# Patient Record
Sex: Female | Born: 1990 | Race: White | Hispanic: No | Marital: Single | Smoking: Never smoker
Health system: Southern US, Community
[De-identification: ages and names within clinical notes are randomized; demographics above are authoritative.]

## PROBLEM LIST (undated history)

## (undated) DIAGNOSIS — F329 Major depressive disorder, single episode, unspecified: Secondary | ICD-10-CM

## (undated) DIAGNOSIS — M2550 Pain in unspecified joint: Secondary | ICD-10-CM

## (undated) DIAGNOSIS — F4310 Post-traumatic stress disorder, unspecified: Secondary | ICD-10-CM

## (undated) DIAGNOSIS — Q774 Achondroplasia: Secondary | ICD-10-CM

## (undated) DIAGNOSIS — F419 Anxiety disorder, unspecified: Secondary | ICD-10-CM

## (undated) DIAGNOSIS — Z9889 Other specified postprocedural states: Secondary | ICD-10-CM

## (undated) DIAGNOSIS — F32A Depression, unspecified: Secondary | ICD-10-CM

## (undated) DIAGNOSIS — M1990 Unspecified osteoarthritis, unspecified site: Secondary | ICD-10-CM

## (undated) DIAGNOSIS — M329 Systemic lupus erythematosus, unspecified: Secondary | ICD-10-CM

## (undated) HISTORY — DX: Unspecified osteoarthritis, unspecified site: M19.90

## (undated) HISTORY — PX: OTHER SURGICAL HISTORY: SHX169

## (undated) HISTORY — DX: Systemic lupus erythematosus, unspecified: M32.9

## (undated) HISTORY — DX: Depression, unspecified: F32.A

## (undated) HISTORY — DX: Major depressive disorder, single episode, unspecified: F32.9

---
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
## 2012-01-17 DIAGNOSIS — M25552 Pain in left hip: Secondary | ICD-10-CM | POA: Insufficient documentation

## 2012-08-15 IMAGING — CR DG CHEST 2V
1 series · 2 of 2 positions shown · non-contrast
Comparison: none

REASON FOR EXAM: rt side rib cage pain
COMMENTS:

[Series 1: view not recorded · 0.17mm/px · 2 of 2 slices shown]
[im 1/2]
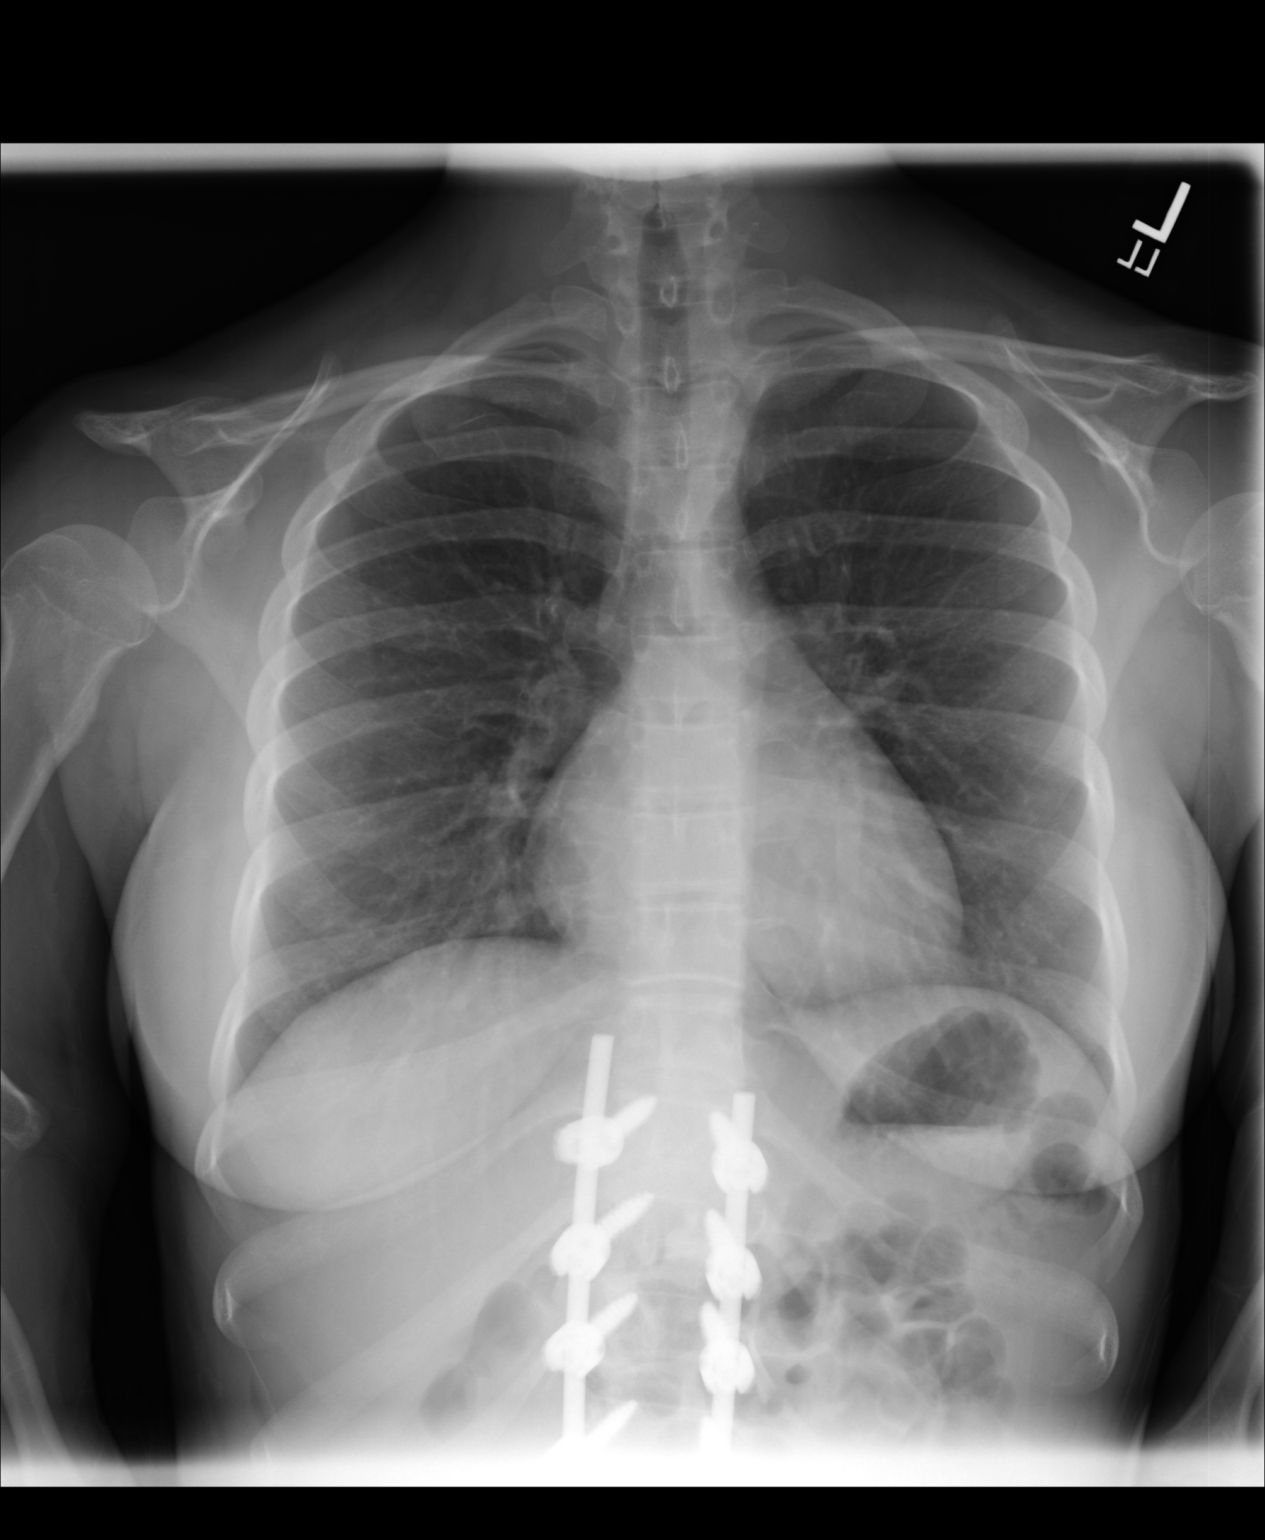
[im 2/2]
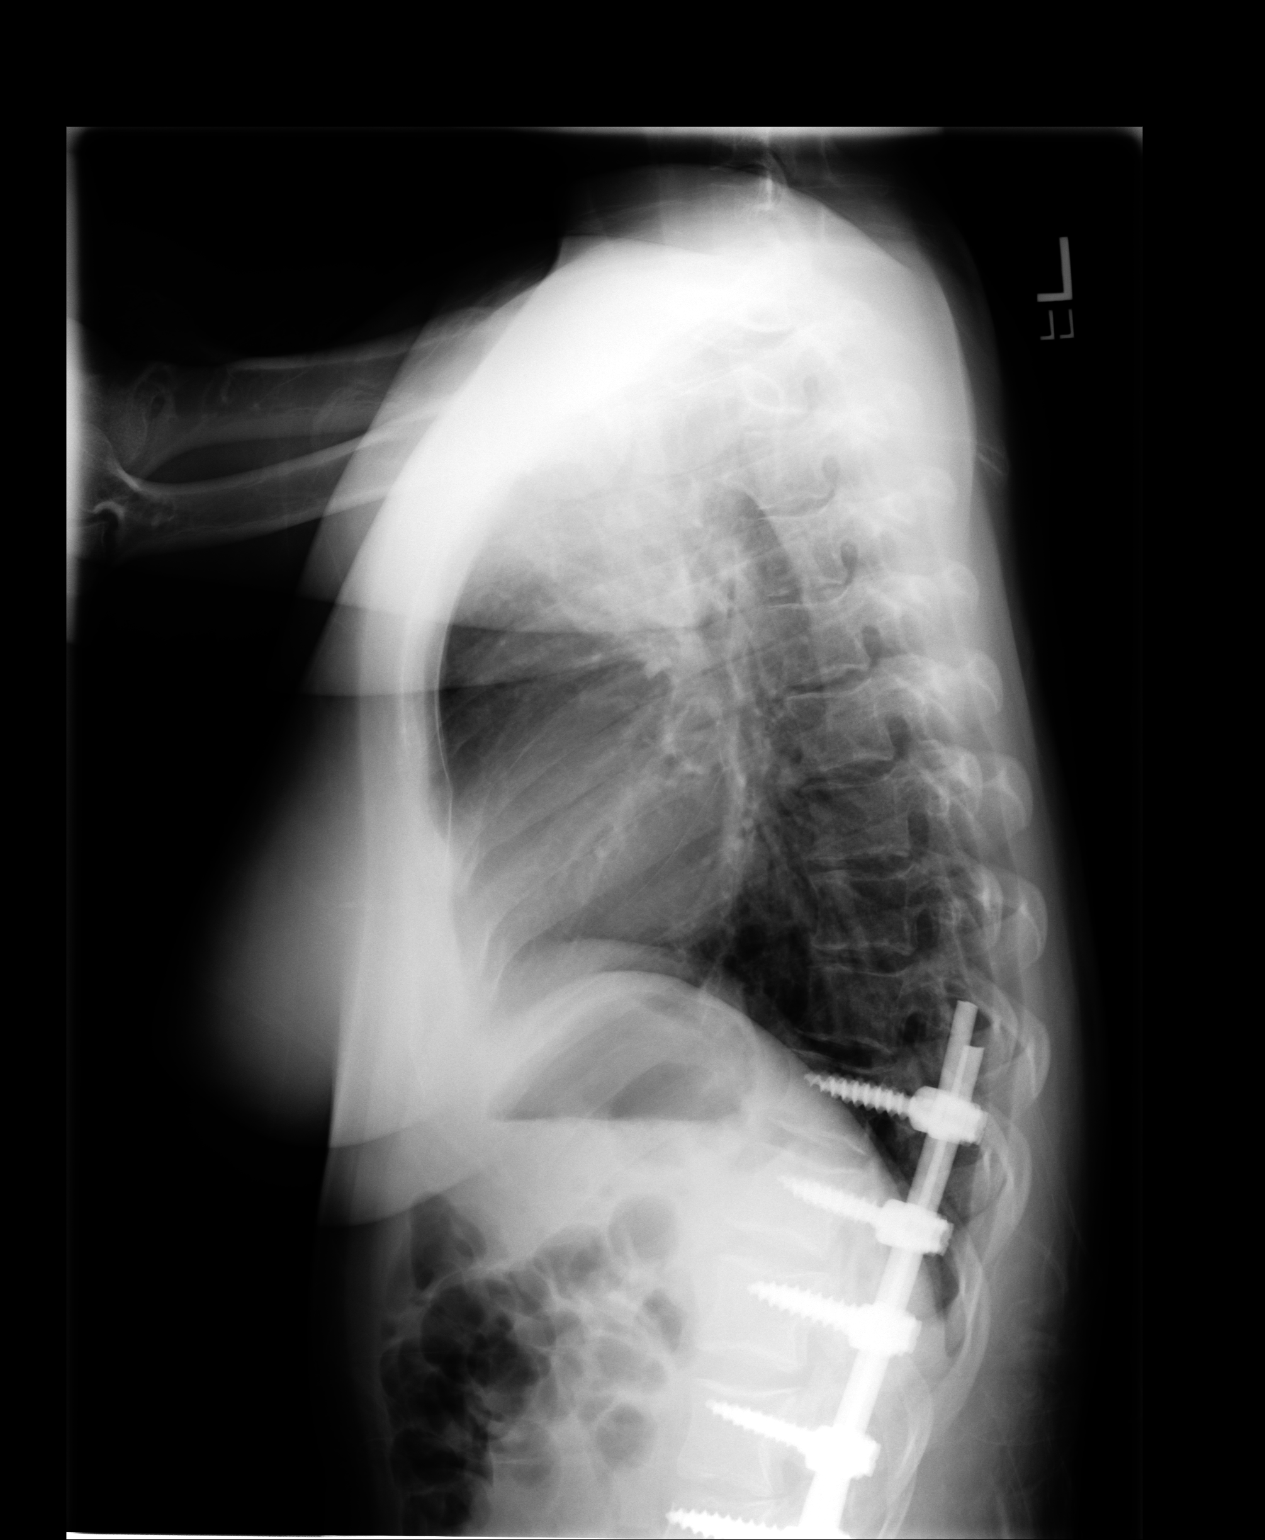

[2 of 2 positions shown; findings below may reference images not displayed]

PROCEDURE:     DXR - DXR CHEST PA (OR AP) AND LATERAL  - December 31, 2010  [DATE]

RESULT:     The lungs are well-expanded. There is no focal infiltrate. There
is no evidence of a pleural effusion. The cardiac silhouette is normal in
size. The pulmonary vascularity is not engorged. There is no pleural
effusion.
IMPRESSION: There is hyperinflation which likely reflects reactive
airway disease. I see no evidence of pneumonia nor pneumothorax or pleural
effusion. No rib abnormality is demonstrated.

## 2013-03-22 DIAGNOSIS — M542 Cervicalgia: Secondary | ICD-10-CM | POA: Insufficient documentation

## 2013-10-06 ENCOUNTER — Other Ambulatory Visit: Payer: Self-pay | Admitting: Student

## 2013-10-06 LAB — CBC WITH DIFFERENTIAL/PLATELET
BASOS ABS: 0 10*3/uL (ref 0.0–0.1)
BASOS PCT: 0.7 %
Eosinophil #: 0.1 10*3/uL (ref 0.0–0.7)
Eosinophil %: 1.6 %
HCT: 40.3 % (ref 35.0–47.0)
HGB: 13.6 g/dL (ref 12.0–16.0)
LYMPHS ABS: 1.5 10*3/uL (ref 1.0–3.6)
Lymphocyte %: 32 %
MCH: 32.4 pg (ref 26.0–34.0)
MCHC: 33.8 g/dL (ref 32.0–36.0)
MCV: 96 fL (ref 80–100)
MONO ABS: 0.5 x10 3/mm (ref 0.2–0.9)
Monocyte %: 10.2 %
NEUTROS PCT: 55.5 %
Neutrophil #: 2.5 10*3/uL (ref 1.4–6.5)
PLATELETS: 295 10*3/uL (ref 150–440)
RBC: 4.2 10*6/uL (ref 3.80–5.20)
RDW: 12.4 % (ref 11.5–14.5)
WBC: 4.6 10*3/uL (ref 3.6–11.0)

## 2013-10-06 LAB — T4, FREE: Free Thyroxine: 1.15 ng/dL (ref 0.76–1.46)

## 2013-10-06 LAB — TSH: THYROID STIMULATING HORM: 0.987 u[IU]/mL

## 2014-06-13 DIAGNOSIS — M25311 Other instability, right shoulder: Secondary | ICD-10-CM | POA: Insufficient documentation

## 2014-06-13 DIAGNOSIS — R059 Cough, unspecified: Secondary | ICD-10-CM | POA: Insufficient documentation

## 2014-06-13 DIAGNOSIS — R200 Anesthesia of skin: Secondary | ICD-10-CM | POA: Insufficient documentation

## 2014-06-13 DIAGNOSIS — R05 Cough: Secondary | ICD-10-CM | POA: Insufficient documentation

## 2018-03-13 DIAGNOSIS — M329 Systemic lupus erythematosus, unspecified: Secondary | ICD-10-CM | POA: Diagnosis not present

## 2018-03-13 DIAGNOSIS — E063 Autoimmune thyroiditis: Secondary | ICD-10-CM | POA: Diagnosis not present

## 2018-05-14 DIAGNOSIS — E063 Autoimmune thyroiditis: Secondary | ICD-10-CM | POA: Diagnosis not present

## 2018-05-14 DIAGNOSIS — M329 Systemic lupus erythematosus, unspecified: Secondary | ICD-10-CM | POA: Diagnosis not present

## 2018-06-30 ENCOUNTER — Ambulatory Visit: Payer: Self-pay | Admitting: Internal Medicine

## 2018-07-14 ENCOUNTER — Ambulatory Visit: Payer: 59 | Admitting: Internal Medicine

## 2018-07-14 ENCOUNTER — Encounter: Payer: Self-pay | Admitting: Internal Medicine

## 2018-07-14 DIAGNOSIS — M2550 Pain in unspecified joint: Secondary | ICD-10-CM | POA: Diagnosis not present

## 2018-07-14 DIAGNOSIS — E049 Nontoxic goiter, unspecified: Secondary | ICD-10-CM

## 2018-07-14 DIAGNOSIS — Q774 Achondroplasia: Secondary | ICD-10-CM

## 2018-07-14 DIAGNOSIS — Z0000 Encounter for general adult medical examination without abnormal findings: Secondary | ICD-10-CM

## 2018-07-14 DIAGNOSIS — F324 Major depressive disorder, single episode, in partial remission: Secondary | ICD-10-CM

## 2018-07-14 DIAGNOSIS — R200 Anesthesia of skin: Secondary | ICD-10-CM

## 2018-07-14 DIAGNOSIS — R202 Paresthesia of skin: Secondary | ICD-10-CM

## 2018-07-14 MED ORDER — VENLAFAXINE HCL ER 37.5 MG PO CP24: 37.5000 mg | ORAL_CAPSULE | Freq: Every day | ORAL | 3 refills | Status: DC

## 2018-07-14 MED ORDER — MELOXICAM 15 MG PO TABS: 15.0000 mg | ORAL_TABLET | Freq: Every day | ORAL | 3 refills | Status: DC

## 2018-07-14 NOTE — Progress Notes (Signed)
New Patient Office Visit  Subjective:  Patient ID: Alexis Williams, female    DOB: 08-30-1990  Age: 28 y.o. MRN: [id number]  CC:  Chief Complaint  Patient presents with  . New Patient (Initial Visit)    pt is concerned about her heart  . Lupus  . Arthritis  . Alopecia   HPI Alexis Williams is 28 year old Caucasian female presents for establishment of PCP Pt has multiple complaints with some new diagnoses of  Lupus and Hashimotos 2019, ulcers, fatigue and rash, alopecia.  Lately pt is c/o severe stiffness in her joints, she is not sure of RA. She feels like she is unable to carry out all her daily activities any more, every day is becoming a struggle, she is unable to do things that she had enjoyed in the past. This is contributing to depression. She is also losing her hair, no treatment or follow up has been made for further test for Hashimotos  Her past medical history consists of  achondroplasia. She underwent posterior spinal fusion due to spinal stenosis and kyphotic deformity in 2008. She had developed progressive back pain over the spring of 2012 with reports of numbness in her lower extremity. The MRI scan done at that time showed stenosis above and below her previous surgical fusion. Therefore, she underwent removal of the spinal instrumentation and application of bone graft by Dr. Guss Bunde and Dr. Fatima Sanger in July of 2012. Marland Kitchen Past Medical History:  Diagnosis Date  . Arthritis   . Depression   . Lupus ([place])      Family History  Problem Relation Age of Onset  . Lung cancer Maternal Grandmother   . COPD Maternal Grandmother     Social History   Socioeconomic History  . Marital status: Single    Spouse name: Not on file  . Number of children: Not on file  . Years of education: Not on file  . Highest education level: Not on file  Occupational History  . Not on file  Social Needs  . Financial resource strain: Not on file  . Food insecurity:    Worry: Not on file     Inability: Not on file  . Transportation needs:    Medical: Not on file    Non-medical: Not on file  Tobacco Use  . Smoking status: Never Smoker  . Smokeless tobacco: Never Used  Substance and Sexual Activity  . Alcohol use: Never    Frequency: Never  . Drug use: Never  . Sexual activity: Not on file  Lifestyle  . Physical activity:    Days per week: Not on file    Minutes per session: Not on file  . Stress: Not on file  Relationships  . Social connections:    Talks on phone: Not on file    Gets together: Not on file    Attends religious service: Not on file    Active member of club or organization: Not on file    Attends meetings of clubs or organizations: Not on file    Relationship status: Not on file  . Intimate partner violence:    Fear of current or ex partner: Not on file    Emotionally abused: Not on file    Physically abused: Not on file    Forced sexual activity: Not on file  Other Topics Concern  . Not on file  Social History Narrative  . Not on file    ROS Review of Systems  Constitutional:  Positive for fatigue. Negative for chills and diaphoresis.       Hair loss  HENT: Negative for ear pain, postnasal drip and sinus pressure.   Eyes: Negative for photophobia, discharge, redness, itching and visual disturbance.  Respiratory: Negative for cough, shortness of breath and wheezing.   Cardiovascular: Negative for chest pain, palpitations and leg swelling.  Gastrointestinal: Negative for abdominal pain, constipation, diarrhea, nausea and vomiting.  Genitourinary: Negative for dysuria and flank pain.  Musculoskeletal: Positive for arthralgias and gait problem. Negative for back pain and neck pain.       Joint pain and selling   Skin: Negative for color change.  Allergic/Immunologic: Negative for environmental allergies and food allergies.  Neurological: Positive for numbness. Negative for dizziness and headaches.  Hematological: Does not bruise/bleed easily.   Psychiatric/Behavioral: Positive for agitation. Negative for behavioral problems (depression), hallucinations and suicidal ideas.       Depression     Objective:   Today's Vitals: BP 134/80 (BP Location: Right Arm, Patient Position: Sitting, Cuff Size: Normal)   Pulse 80   Ht 4' 1.75" (1.264 m)   Wt 92 lb (41.7 kg)   SpO2 97%   BMI 26.13 kg/m   Physical Exam Constitutional:      General: She is not in acute distress.    Appearance: She is well-developed. She is not diaphoretic.  HENT:     Head: Normocephalic and atraumatic.     Nose: Nose normal.     Mouth/Throat:     Pharynx: No oropharyngeal exudate.  Eyes:     Pupils: Pupils are equal, round, and reactive to light.  Neck:     Musculoskeletal: Normal range of motion and neck supple.     Thyroid: Thyromegaly present.     Vascular: No JVD.     Trachea: No tracheal deviation.  Cardiovascular:     Rate and Rhythm: Normal rate and regular rhythm.     Heart sounds: Normal heart sounds. No murmur. No friction rub. No gallop.   Pulmonary:     Effort: Pulmonary effort is normal. No respiratory distress.     Breath sounds: No wheezing or rales.  Chest:     Chest wall: No tenderness.  Abdominal:     General: Bowel sounds are normal.     Palpations: Abdomen is soft.  Musculoskeletal:        General: Swelling, tenderness and deformity present.  Lymphadenopathy:     Cervical: No cervical adenopathy.  Skin:    General: Skin is warm and dry.     Findings: Erythema present.  Neurological:     Mental Status: She is alert and oriented to person, place, and time.     Cranial Nerves: No cranial nerve deficit.  Psychiatric:        Behavior: Behavior normal.        Thought Content: Thought content normal.        Judgment: Judgment normal.    Assessment & Plan:  1. Routine general medical examination at a health care facility - Labs ordered   2. Goiter - U/S of thyroid is ordered,  Labs for Hashimoto is ordered    3.  Achondroplasia - Per ortho   4. Arthralgia, unspecified joint - Continue mobic as before . ANA, sed rate RA factor ordered   5. Depression, major, single episode, in partial remission (HCC) - Refilled Effexor  6. Numbness and tingling - Check M38 and folic acid   Problem List Items Addressed This Visit  Endocrine   Goiter   Relevant Orders   CBC with Differential/Platelet   Lipid Panel With LDL/HDL Ratio   TSH   T4, free   Comprehensive metabolic panel   Sed Rate (ESR)   ANA w/Reflex if Positive   US THYROID   Thyroglobulin antibody   Thyroid peroxidase antibody   Vitamin D 1,25 dihydroxy   B12 and Folate Panel   Fe+TIBC+Fer     Musculoskeletal and Integument   Achondroplasia   Relevant Orders   CBC with Differential/Platelet   Lipid Panel With LDL/HDL Ratio   TSH   T4, free   Comprehensive metabolic panel   Sed Rate (ESR)   ANA w/Reflex if Positive   US THYROID   Thyroglobulin antibody   Thyroid peroxidase antibody   Vitamin D 1,25 dihydroxy   B12 and Folate Panel   Fe+TIBC+Fer     Other   Routine general medical examination at a health care facility   Relevant Orders   CBC with Differential/Platelet   Lipid Panel With LDL/HDL Ratio   TSH   T4, free   Comprehensive metabolic panel   Sed Rate (ESR)   ANA w/Reflex if Positive   US THYROID   Thyroglobulin antibody   Thyroid peroxidase antibody   Vitamin D 1,25 dihydroxy   B12 and Folate Panel   Fe+TIBC+Fer   Arthralgia   Depression, major, single episode, in partial remission (HCC)   Relevant Medications   venlafaxine XR (EFFEXOR XR) 37.5 MG 24 hr capsule    Other Visit Diagnoses    Numbness and tingling          Outpatient Encounter Medications as of 07/14/2018  Medication Sig  . hydroxychloroquine (PLAQUENIL) 200 MG tablet Take 200 mg by mouth daily. Take two tablets by mouth once daily for 30 days  . [DISCONTINUED] meloxicam (MOBIC) 15 MG tablet Take 15 mg by mouth daily. Take one  tablet by mouth daily for 30 days.  . [DISCONTINUED] venlafaxine (EFFEXOR) 37.5 MG tablet Take 37.5 mg by mouth daily. Take one capsule by mouth once daily with food  . meloxicam (MOBIC) 15 MG tablet Take 1 tablet (15 mg total) by mouth daily.  Marland Kitchen venlafaxine XR (EFFEXOR XR) 37.5 MG 24 hr capsule Take 1 capsule (37.5 mg total) by mouth daily with breakfast.   No facility-administered encounter medications on file as of 07/14/2018.     Follow-up: No follow-ups on file.   Clayborn Bigness, MD

## 2018-07-21 ENCOUNTER — Encounter: Payer: Self-pay | Admitting: Internal Medicine

## 2018-07-23 DIAGNOSIS — Z0000 Encounter for general adult medical examination without abnormal findings: Secondary | ICD-10-CM | POA: Insufficient documentation

## 2018-07-23 DIAGNOSIS — F324 Major depressive disorder, single episode, in partial remission: Secondary | ICD-10-CM | POA: Insufficient documentation

## 2018-07-23 DIAGNOSIS — E049 Nontoxic goiter, unspecified: Secondary | ICD-10-CM | POA: Insufficient documentation

## 2018-07-23 DIAGNOSIS — M2550 Pain in unspecified joint: Secondary | ICD-10-CM | POA: Insufficient documentation

## 2018-10-16 ENCOUNTER — Other Ambulatory Visit: Payer: Self-pay

## 2018-10-16 DIAGNOSIS — E049 Nontoxic goiter, unspecified: Secondary | ICD-10-CM

## 2018-10-16 DIAGNOSIS — Z0000 Encounter for general adult medical examination without abnormal findings: Secondary | ICD-10-CM

## 2018-10-16 DIAGNOSIS — Q774 Achondroplasia: Secondary | ICD-10-CM

## 2018-10-26 LAB — COMPREHENSIVE METABOLIC PANEL
ALT: 16 IU/L (ref 0–32)
AST: 17 IU/L (ref 0–40)
Albumin/Globulin Ratio: 2.2 (ref 1.2–2.2)
Albumin: 5 g/dL (ref 3.9–5.0)
Alkaline Phosphatase: 60 IU/L (ref 39–117)
BUN/Creatinine Ratio: 17 (ref 9–23)
BUN: 11 mg/dL (ref 6–20)
Bilirubin Total: 1.3 mg/dL — ABNORMAL HIGH (ref 0.0–1.2)
CO2: 22 mmol/L (ref 20–29)
Calcium: 10.1 mg/dL (ref 8.7–10.2)
Chloride: 104 mmol/L (ref 96–106)
Creatinine, Ser: 0.65 mg/dL (ref 0.57–1.00)
GFR calc non Af Amer: 124 mL/min/{1.73_m2} (ref >59)
GFR, EST AFRICAN AMERICAN: 143 mL/min/{1.73_m2} (ref >59)
GLOBULIN, TOTAL: 2.3 g/dL (ref 1.5–4.5)
Glucose: 86 mg/dL (ref 65–99)
Potassium: 4.1 mmol/L (ref 3.5–5.2)
Sodium: 140 mmol/L (ref 134–144)
Total Protein: 7.3 g/dL (ref 6.0–8.5)

## 2018-10-26 LAB — LIPID PANEL WITH LDL/HDL RATIO
Cholesterol, Total: 173 mg/dL (ref 100–199)
HDL: 70 mg/dL (ref >39)
LDL Calculated: 90 mg/dL (ref 0–99)
LDl/HDL Ratio: 1.3 ratio (ref 0.0–3.2)
Triglycerides: 67 mg/dL (ref 0–149)
VLDL Cholesterol Cal: 13 mg/dL (ref 5–40)

## 2018-10-26 LAB — IRON,TIBC AND FERRITIN PANEL
Ferritin: 51 ng/mL (ref 15–150)
IRON: 118 ug/dL (ref 27–159)
Iron Saturation: 30 % (ref 15–55)
Total Iron Binding Capacity: 387 ug/dL (ref 250–450)
UIBC: 269 ug/dL (ref 131–425)

## 2018-10-26 LAB — SEDIMENTATION RATE: Sed Rate: 2 mm/hr (ref 0–32)

## 2018-10-26 LAB — T4, FREE: Free T4: 1.56 ng/dL (ref 0.82–1.77)

## 2018-10-26 LAB — VITAMIN D 1,25 DIHYDROXY
Vitamin D 1, 25 (OH)2 Total: 49 pg/mL
Vitamin D2 1, 25 (OH)2: <10 pg/mL
Vitamin D3 1, 25 (OH)2: 49 pg/mL

## 2018-10-26 LAB — B12 AND FOLATE PANEL
Folate: >20 ng/mL (ref >3.0)
Vitamin B-12: 737 pg/mL (ref 232–1245)

## 2018-10-26 LAB — ANA W/REFLEX IF POSITIVE: Anti Nuclear Antibody (ANA): NEGATIVE

## 2018-11-10 ENCOUNTER — Other Ambulatory Visit: Payer: Self-pay | Admitting: Internal Medicine

## 2018-11-10 DIAGNOSIS — M2550 Pain in unspecified joint: Secondary | ICD-10-CM

## 2018-11-10 DIAGNOSIS — Q774 Achondroplasia: Secondary | ICD-10-CM

## 2018-11-10 NOTE — Progress Notes (Unsigned)
rheu

## 2022-06-17 ENCOUNTER — Other Ambulatory Visit
Admission: RE | Admit: 2022-06-17 | Discharge: 2022-06-17 | Disposition: A | Discharge status: Home / Self Care | Payer: Medicaid Other | Source: Ambulatory Visit | Attending: Nurse Practitioner | Admitting: Nurse Practitioner

## 2022-06-17 ENCOUNTER — Encounter: Payer: Self-pay | Admitting: Nurse Practitioner

## 2022-06-17 ENCOUNTER — Ambulatory Visit (INDEPENDENT_AMBULATORY_CARE_PROVIDER_SITE_OTHER): Payer: Medicaid Other | Admitting: Nurse Practitioner

## 2022-06-17 VITALS — BP 116/64 | HR 82 | Temp 97.0°F | Resp 16 | Ht <= 58 in | Wt 88.2 lb

## 2022-06-17 DIAGNOSIS — R5383 Other fatigue: Secondary | ICD-10-CM

## 2022-06-17 DIAGNOSIS — F32A Depression, unspecified: Secondary | ICD-10-CM

## 2022-06-17 DIAGNOSIS — M064 Inflammatory polyarthropathy: Secondary | ICD-10-CM | POA: Insufficient documentation

## 2022-06-17 DIAGNOSIS — Z9889 Other specified postprocedural states: Secondary | ICD-10-CM | POA: Diagnosis not present

## 2022-06-17 DIAGNOSIS — E559 Vitamin D deficiency, unspecified: Secondary | ICD-10-CM | POA: Insufficient documentation

## 2022-06-17 DIAGNOSIS — E782 Mixed hyperlipidemia: Secondary | ICD-10-CM | POA: Insufficient documentation

## 2022-06-17 DIAGNOSIS — E049 Nontoxic goiter, unspecified: Secondary | ICD-10-CM | POA: Diagnosis not present

## 2022-06-17 DIAGNOSIS — E538 Deficiency of other specified B group vitamins: Secondary | ICD-10-CM

## 2022-06-17 DIAGNOSIS — F419 Anxiety disorder, unspecified: Secondary | ICD-10-CM

## 2022-06-17 LAB — CBC WITH DIFFERENTIAL/PLATELET
Abs Immature Granulocytes: 0.02 10*3/uL (ref 0.00–0.07)
Basophils Absolute: 0 10*3/uL (ref 0.0–0.1)
Basophils Relative: 0 %
Eosinophils Absolute: 0.1 10*3/uL (ref 0.0–0.5)
Eosinophils Relative: 1 %
HCT: 38.4 % (ref 36.0–46.0)
Hemoglobin: 12.9 g/dL (ref 12.0–15.0)
Immature Granulocytes: 0 %
Lymphocytes Relative: 25 %
Lymphs Abs: 1.8 10*3/uL (ref 0.7–4.0)
MCH: 31.6 pg (ref 26.0–34.0)
MCHC: 33.6 g/dL (ref 30.0–36.0)
MCV: 94.1 fL (ref 80.0–100.0)
Monocytes Absolute: 0.5 10*3/uL (ref 0.1–1.0)
Monocytes Relative: 7 %
Neutro Abs: 4.8 10*3/uL (ref 1.7–7.7)
Neutrophils Relative %: 67 %
Platelets: 335 10*3/uL (ref 150–400)
RBC: 4.08 MIL/uL (ref 3.87–5.11)
RDW: 12 % (ref 11.5–15.5)
WBC: 7.3 10*3/uL (ref 4.0–10.5)
nRBC: 0 % (ref 0.0–0.2)

## 2022-06-17 LAB — COMPREHENSIVE METABOLIC PANEL
ALT: 14 U/L (ref 0–44)
AST: 18 U/L (ref 15–41)
Albumin: 4.4 g/dL (ref 3.5–5.0)
Alkaline Phosphatase: 52 U/L (ref 38–126)
Anion gap: 7 (ref 5–15)
BUN: 21 mg/dL — ABNORMAL HIGH (ref 6–20)
CO2: 26 mmol/L (ref 22–32)
Calcium: 9.6 mg/dL (ref 8.9–10.3)
Chloride: 102 mmol/L (ref 98–111)
Creatinine, Ser: 0.51 mg/dL (ref 0.44–1.00)
GFR, Estimated: >60 mL/min (ref >60)
Glucose, Bld: 111 mg/dL — ABNORMAL HIGH (ref 70–99)
Potassium: 4 mmol/L (ref 3.5–5.1)
Sodium: 135 mmol/L (ref 135–145)
Total Bilirubin: 0.8 mg/dL (ref 0.3–1.2)
Total Protein: 7.2 g/dL (ref 6.5–8.1)

## 2022-06-17 LAB — SEDIMENTATION RATE: Sed Rate: 6 mm/hr (ref 0–20)

## 2022-06-17 LAB — LIPID PANEL
Cholesterol: 172 mg/dL (ref 0–200)
HDL: 70 mg/dL (ref >40)
LDL Cholesterol: 88 mg/dL (ref 0–99)
Total CHOL/HDL Ratio: 2.5 RATIO
Triglycerides: 68 mg/dL (ref <150)
VLDL: 14 mg/dL (ref 0–40)

## 2022-06-17 LAB — TSH: TSH: 1.536 u[IU]/mL (ref 0.350–4.500)

## 2022-06-17 LAB — VITAMIN B12: Vitamin B-12: 476 pg/mL (ref 180–914)

## 2022-06-17 LAB — T4, FREE: Free T4: 0.91 ng/dL (ref 0.61–1.12)

## 2022-06-17 LAB — C-REACTIVE PROTEIN: CRP: 0.7 mg/dL (ref <1.0)

## 2022-06-17 LAB — FOLATE: Folate: 13 ng/mL (ref >5.9)

## 2022-06-17 MED ORDER — MELOXICAM 7.5 MG PO TABS: 7.5000 mg | ORAL_TABLET | Freq: Every day | ORAL | 2 refills | Status: DC

## 2022-06-17 MED ORDER — DULOXETINE HCL 20 MG PO CPEP: 20.0000 mg | ORAL_CAPSULE | Freq: Every day | ORAL | 3 refills | Status: DC

## 2022-06-17 NOTE — Progress Notes (Signed)
[facility] [place], [place] [address]  Internal MEDICINE  Office Visit Note  Patient Name: Alexis Williams  [id number]  [id number]  Date of Service: 06/17/2022   Complaints/HPI Pt is here for establishment of PCP. Chief Complaint  Patient presents with   New Patient (Initial Visit)    HPI [name] presents for a new patient visit to establish care.  Well-appearing 32 y.o. female with depression, anxiety, PTSD, arthritis of multiple joints, prior diagnosis of lupus Surgical history ---spinal fusion, ostectomy, and leg surgery.  Significant FH: COPD, addison's disease Work: looking for job Home: living with parents for now Diet: healthy Exercise: walking, lifting weights  Tobacco use: none Alcohol use: none Illicit drug use: marijuana sometimes.  Pap smear: plans on going to OBGYN Labs: due for labs  New or worsening pain: chronic joint pains.   Prior dx of lupus  Referral obgyn Anxiety and depression -- anxiety is worse. Has panic attacks and PTSD. Interested in trying medication, has only been on zoloft before. See screenings below. 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 Red papules, not itchy. No changes in detergent, lotions or soaps, had a cold or URI a few weeks ago. No new medications recently. Patient concerned could be acne?        06/17/2022    3:16 PM  Depression screen PHQ 2/9  Decreased Interest 1  Down, Depressed, Hopeless 2  PHQ - 2 Score 3  Altered sleeping 3  Tired, decreased energy 2  Change in appetite 0  Feeling bad or failure about yourself  0  Trouble concentrating 2  Moving slowly or fidgety/restless 0  Suicidal thoughts 0  PHQ-9 Score 10  Difficult doing work/chores Somewhat difficult       06/17/2022    3:55 PM  GAD 7 : Generalized Anxiety Score  Nervous, Anxious, on Edge 3  Control/stop worrying 3  Worry too much - different things 3  Trouble relaxing 3   Restless 3  Easily annoyed or irritable 3  Afraid - awful might happen 3  Total GAD 7 Score 21  Anxiety Difficulty Very difficult       Current Medication: Outpatient Encounter Medications as of 06/17/2022  Medication Sig   DULoxetine (CYMBALTA) 20 MG capsule Take 1 capsule (20 mg total) by mouth daily.   meloxicam (MOBIC) 7.5 MG tablet Take 1 tablet (7.5 mg total) by mouth daily.   [DISCONTINUED] hydroxychloroquine (PLAQUENIL) 200 MG tablet Take 200 mg by mouth daily. Take two tablets by mouth once daily for 30 days   [DISCONTINUED] meloxicam (MOBIC) 15 MG tablet Take 1 tablet (15 mg total) by mouth daily.   [DISCONTINUED] venlafaxine XR (EFFEXOR XR) 37.5 MG 24 hr capsule Take 1 capsule (37.5 mg total) by mouth daily with breakfast.   No facility-administered encounter medications on file as of 06/17/2022.    Surgical History: Past Surgical History:  Procedure Laterality Date   LEG SURGERY  2005   OSTECTOMY  2002   spinal infusion  2010,2012    Medical History: Past Medical History:  Diagnosis Date   Arthritis    Depression    Lupus ([place])     Family History: Family History  Problem Relation Age of Onset   Lung cancer Maternal Grandmother    COPD Maternal Grandmother     Social History   Socioeconomic History   Marital status: Single    Spouse name: Not on  file   Number of children: Not on file   Years of education: Not on file   Highest education level: Not on file  Occupational History   Not on file  Tobacco Use   Smoking status: Never   Smokeless tobacco: Never  Substance and Sexual Activity   Alcohol use: Never   Drug use: Never   Sexual activity: Yes  Other Topics Concern   Not on file  Social History Narrative   Not on file   Social Determinants of Health   Financial Resource Strain: Not on file  Food Insecurity: Not on file  Transportation Needs: Not on file  Physical Activity: Not on file  Stress: Not on file  Social Connections: Not on  file  Intimate Partner Violence: Not on file     Review of Systems  Constitutional:  Positive for fatigue. Negative for chills and unexpected weight change.  HENT:  Negative for congestion, postnasal drip, rhinorrhea, sneezing and sore throat.   Eyes:  Negative for redness.  Respiratory:  Negative for cough, chest tightness, shortness of breath and wheezing.   Cardiovascular: Negative.  Negative for chest pain and palpitations.  Gastrointestinal:  Negative for abdominal pain, constipation, diarrhea, nausea and vomiting.  Musculoskeletal:  Positive for arthralgias and back pain. Negative for joint swelling and neck pain.  Skin:  Negative for rash.  Neurological: Negative.  Negative for tremors and numbness.  Hematological:  Negative for adenopathy. Does not bruise/bleed easily.  Psychiatric/Behavioral:  Positive for behavioral problems (Depression) and sleep disturbance. Negative for self-injury and suicidal ideas. The patient is nervous/anxious.     Vital Signs: BP 116/64   Pulse 82   Temp (!) 97 F (36.1 C)   Resp 16   Ht 4\' 2"  (1.27 m)   Wt 88 lb 3.2 oz (40 kg)   SpO2 97%   BMI 24.80 kg/m    Physical Exam Vitals reviewed.  Constitutional:      General: She is not in acute distress.    Appearance: Normal appearance. She is normal weight. She is not ill-appearing.  HENT:     Head: Normocephalic and atraumatic.  Eyes:     Pupils: Pupils are equal, round, and reactive to light.  Cardiovascular:     Rate and Rhythm: Normal rate.  Pulmonary:     Effort: Pulmonary effort is normal. No respiratory distress.  Skin:    Findings: Rash present. Rash is papular (arms and back; nonpruritic).  Neurological:     Mental Status: She is alert and oriented to person, place, and time.  Psychiatric:        Mood and Affect: Affect normal. Mood is anxious and depressed.        Behavior: Behavior normal. Behavior is cooperative.        Thought Content: Thought content is not paranoid or  delusional. Thought content does not include homicidal or suicidal ideation.       Assessment/Plan: 1. Inflammatory polyarthritis (HCC) Meloxicam ordered to help with significant arthritic pain Labs ordered to evaluate for possible autoimmune causes. Review labs in 1 week with patient. Consider rheumatology referral pending labs  - CBC with Differential/Platelet - CMP14+EGFR - Sed Rate (ESR) - C-reactive protein - ANA Direct w/Reflex if Positive - Rheumatoid Factor - meloxicam (MOBIC) 7.5 MG tablet; Take 1 tablet (7.5 mg total) by mouth daily.  Dispense: 30 tablet; Refill: 2  2. Goiter Thyroid labs ordered, also has family history of endocrine problems.  - TSH + free T4 -  Thyroid peroxidase antibody - Thyroglobulin antibody - T3  3. Other fatigue Significant fatigue, rule out possible causes including anemia, vitamin deficiencies, autoimmune disorders, thyroid problems. Review labs with patient in 1 week per request.  - CBC with Differential/Platelet - B12 and Folate Panel - Vitamin D (25 hydroxy) - TSH + free T4 - Thyroid peroxidase antibody - Thyroglobulin antibody - T3 - Sed Rate (ESR) - C-reactive protein - ANA Direct w/Reflex if Positive - Rheumatoid Factor  4. B12 deficiency Evaluate for deficiency  - CBC with Differential/Platelet - B12 and Folate Panel  5. Mixed hyperlipidemia Routine and additional labs ordered - CMP14+EGFR - Lipid Profile - TSH + free T4 - Thyroid peroxidase antibody - Thyroglobulin antibody - T3  6. Vitamin D deficiency Routine lab ordered - Vitamin D (25 hydroxy)  7. History of back surgery Labs ordered for further evaluation Referred to orthopedic to reestablish with spinal surgery at [facility] - Sed Rate (ESR) - C-reactive protein - ANA Direct w/Reflex if Positive - Rheumatoid Factor - Ambulatory referral to Orthopedic Surgery  8. Anxiety and depression Start duloxetine 20 mg daily - DULoxetine (CYMBALTA) 20 MG capsule;  Take 1 capsule (20 mg total) by mouth daily.  Dispense: 30 capsule; Refill: 3    General Counseling: Maris verbalizes understanding of the findings of todays visit and agrees with plan of treatment. I have discussed any further diagnostic evaluation that may be needed or ordered today. We also reviewed her medications today. she has been encouraged to call the office with any questions or concerns that should arise related to todays visit.    Orders Placed This Encounter  Procedures   CBC with Differential/Platelet   CMP14+EGFR   Lipid Profile   B12 and Folate Panel   Vitamin D (25 hydroxy)   TSH + free T4   Thyroid peroxidase antibody   Thyroglobulin antibody   T3   Sed Rate (ESR)   C-reactive protein   ANA Direct w/Reflex if Positive   Rheumatoid Factor   Ambulatory referral to Orthopedic Surgery    Meds ordered this encounter  Medications   DULoxetine (CYMBALTA) 20 MG capsule    Sig: Take 1 capsule (20 mg total) by mouth daily.    Dispense:  30 capsule    Refill:  3   meloxicam (MOBIC) 7.5 MG tablet    Sig: Take 1 tablet (7.5 mg total) by mouth daily.    Dispense:  30 tablet    Refill:  2    Return in about 1 week (around 06/24/2022) for F/U, Labs, Amar Sippel PCP and also need CPE at earliest available opening. .  Time spent:30 Minutes Time spent with patient included reviewing progress notes, labs, imaging studies, and discussing plan for follow up.   [place] Controlled Substance Database was reviewed by me for overdose risk score (ORS)   This patient was seen by Jonetta Osgood, FNP-C in collaboration with Dr. Clayborn Bigness as a part of collaborative care agreement.   Nimo Verastegui R. Valetta Fuller, MSN, FNP-C Internal Medicine

## 2022-06-19 LAB — ANA W/REFLEX IF POSITIVE: Anti Nuclear Antibody (ANA): NEGATIVE

## 2022-06-20 ENCOUNTER — Telehealth: Payer: Self-pay | Admitting: Nurse Practitioner

## 2022-06-20 LAB — RHEUMATOID FACTOR: Rheumatoid fact SerPl-aCnc: <10 IU/mL (ref <14.0)

## 2022-06-20 LAB — THYROID PEROXIDASE ANTIBODY: Thyroperoxidase Ab SerPl-aCnc: 35 IU/mL — ABNORMAL HIGH (ref 0–34)

## 2022-06-20 LAB — T3: T3, Total: 84 ng/dL (ref 71–180)

## 2022-06-20 NOTE — Telephone Encounter (Signed)
Orthopedic referral sent via [facility] to [facility] 

## 2022-06-22 LAB — MISC LABCORP TEST (SEND OUT): Labcorp test code: 81950

## 2022-06-24 ENCOUNTER — Encounter: Payer: Self-pay | Admitting: Nurse Practitioner

## 2022-06-27 ENCOUNTER — Encounter: Payer: Self-pay | Admitting: Nurse Practitioner

## 2022-06-27 ENCOUNTER — Telehealth: Payer: Self-pay | Admitting: Nurse Practitioner

## 2022-06-27 ENCOUNTER — Ambulatory Visit: Payer: Medicaid Other | Admitting: Nurse Practitioner

## 2022-06-27 VITALS — BP 110/65 | HR 79 | Temp 98.2°F | Resp 16 | Ht <= 58 in | Wt 91.2 lb

## 2022-06-27 DIAGNOSIS — R946 Abnormal results of thyroid function studies: Secondary | ICD-10-CM | POA: Diagnosis not present

## 2022-06-27 DIAGNOSIS — J029 Acute pharyngitis, unspecified: Secondary | ICD-10-CM | POA: Diagnosis not present

## 2022-06-27 DIAGNOSIS — E559 Vitamin D deficiency, unspecified: Secondary | ICD-10-CM | POA: Diagnosis not present

## 2022-06-27 DIAGNOSIS — M1380 Other specified arthritis, unspecified site: Secondary | ICD-10-CM

## 2022-06-27 DIAGNOSIS — R7301 Impaired fasting glucose: Secondary | ICD-10-CM

## 2022-06-27 DIAGNOSIS — M064 Inflammatory polyarthropathy: Secondary | ICD-10-CM

## 2022-06-27 MED ORDER — CEPHALEXIN 500 MG PO CAPS: 500.0000 mg | ORAL_CAPSULE | Freq: Two times a day (BID) | ORAL | 0 refills | Status: AC

## 2022-06-27 MED ORDER — VITAMIN D (ERGOCALCIFEROL) 1.25 MG (50000 UNIT) PO CAPS: 50000.0000 [IU] | ORAL_CAPSULE | ORAL | 1 refills | Status: DC

## 2022-06-27 NOTE — Telephone Encounter (Signed)
Orthopedic referral faxed to [facility] per patient request ; [phone]

## 2022-06-27 NOTE — Progress Notes (Signed)
[facility] [place], [place] [address]  Internal MEDICINE  Office Visit Note  Patient Name: Alexis Williams  [id number]  [id number]  Date of Service: 06/27/2022  Chief Complaint  Patient presents with   Follow-up    Review labs.     HPI Alexis Williams presents for a follow-up visit to review lab results.  Labs -- nonfasting -- glucose 111 Low vitamin D  14.3  Seronegative spondyloarthropathy--- chronic back pain, chronic joint pain in multiple joints. Negative ANA, RF, CRP and sed rate.  History of a goiter and now slightly elevated thyroid peroxidase antibody. The rest of the thyroid labs were normal.  Sore throat x a few weeks.    Current Medication: Outpatient Encounter Medications as of 06/27/2022  Medication Sig   [EXPIRED] cephALEXin (KEFLEX) 500 MG capsule Take 1 capsule (500 mg total) by mouth 2 (two) times daily for 10 days. Take with food.   DULoxetine (CYMBALTA) 20 MG capsule Take 1 capsule (20 mg total) by mouth daily.   meloxicam (MOBIC) 7.5 MG tablet Take 1 tablet (7.5 mg total) by mouth daily.   Vitamin D, Ergocalciferol, (DRISDOL) 1.25 MG (50000 UNIT) CAPS capsule Take 1 capsule (50,000 Units total) by mouth every 7 (seven) days.   No facility-administered encounter medications on file as of 06/27/2022.    Surgical History: Past Surgical History:  Procedure Laterality Date   LEG SURGERY  2005   OSTECTOMY  2002   spinal infusion  2010,2012    Medical History: Past Medical History:  Diagnosis Date   Arthritis    Depression    Lupus ([place])     Family History: Family History  Problem Relation Age of Onset   Lung cancer Maternal Grandmother    COPD Maternal Grandmother     Social History   Socioeconomic History   Marital status: Single    Spouse name: Not on file   Number of children: Not on file   Years of education: Not on file   Highest education level: Not on file  Occupational History   Not on file  Tobacco Use   Smoking  status: Never   Smokeless tobacco: Never  Substance and Sexual Activity   Alcohol use: Never   Drug use: Never   Sexual activity: Yes  Other Topics Concern   Not on file  Social History Narrative   Not on file   Social Determinants of Health   Financial Resource Strain: Not on file  Food Insecurity: Not on file  Transportation Needs: Not on file  Physical Activity: Not on file  Stress: Not on file  Social Connections: Not on file  Intimate Partner Violence: Not on file      Review of Systems  Constitutional:  Positive for fatigue. Negative for chills and unexpected weight change.  HENT:  Positive for sore throat. Negative for congestion, postnasal drip, rhinorrhea and sneezing.   Eyes:  Negative for redness.  Respiratory:  Negative for cough, chest tightness, shortness of breath and wheezing.   Cardiovascular: Negative.  Negative for chest pain and palpitations.  Gastrointestinal: Negative.  Negative for abdominal pain, constipation, diarrhea, nausea and vomiting.  Musculoskeletal:  Positive for arthralgias and back pain. Negative for joint swelling and neck pain.  Skin:  Negative for rash.  Neurological: Negative.  Negative for tremors and numbness.  Hematological:  Negative for adenopathy. Does not bruise/bleed easily.  Psychiatric/Behavioral:  Positive for behavioral problems (Depression) and sleep disturbance. Negative for self-injury and suicidal ideas.  The patient is nervous/anxious.     Vital Signs: BP 110/65   Pulse 79   Temp 98.2 F (36.8 C)   Resp 16   Ht 4' 2"$  (1.27 m)   Wt 91 lb 3.2 oz (41.4 kg)   SpO2 98%   BMI 25.65 kg/m    Physical Exam Vitals reviewed.  Constitutional:      General: She is not in acute distress.    Appearance: Normal appearance. She is normal weight. She is not ill-appearing.  HENT:     Head: Normocephalic and atraumatic.     Nose: Congestion present. No rhinorrhea.     Mouth/Throat:     Mouth: Mucous membranes are moist.      Pharynx: Posterior oropharyngeal erythema present.  Eyes:     Pupils: Pupils are equal, round, and reactive to light.  Cardiovascular:     Rate and Rhythm: Normal rate and regular rhythm.  Pulmonary:     Effort: Pulmonary effort is normal. No respiratory distress.  Neurological:     Mental Status: She is alert and oriented to person, place, and time.  Psychiatric:        Mood and Affect: Mood normal.        Behavior: Behavior normal.        Assessment/Plan: 1. Seronegative inflammatory arthritis Referred to rheumatology for further evaluation.  - Ambulatory referral to Rheumatology  2. Sore throat Negative for strep. Empiric treatment prescribed with cephalexin. Allergic to penicillins.  - POCT rapid strep A - cephALEXin (KEFLEX) 500 MG capsule; Take 1 capsule (500 mg total) by mouth 2 (two) times daily for 10 days. Take with food.  Dispense: 20 capsule; Refill: 0  3. Abnormal thyroid function test Abnormal thyroid peroxidase antibody and history of a goiter. Thyroid ultrasound ordered.  - US THYROID; Future  4. Vitamin D deficiency Start weekly prescription vitamin D supplement. Repeat vitamin D level is 6 months  - Vitamin D, Ergocalciferol, (DRISDOL) 1.25 MG (50000 UNIT) CAPS capsule; Take 1 capsule (50,000 Units total) by mouth every 7 (seven) days.  Dispense: 12 capsule; Refill: 1  5. Impaired fasting glucose Elevated glucose of 111 on fasting labs. Consider repeating fasting glucose lab and/or checking A1c at a future office visit.    General Counseling: Senna verbalizes understanding of the findings of todays visit and agrees with plan of treatment. I have discussed any further diagnostic evaluation that may be needed or ordered today. We also reviewed her medications today. she has been encouraged to call the office with any questions or concerns that should arise related to todays visit.    Orders Placed This Encounter  Procedures   US THYROID   Ambulatory  referral to Rheumatology   POCT rapid strep A    Meds ordered this encounter  Medications   Vitamin D, Ergocalciferol, (DRISDOL) 1.25 MG (50000 UNIT) CAPS capsule    Sig: Take 1 capsule (50,000 Units total) by mouth every 7 (seven) days.    Dispense:  12 capsule    Refill:  1   cephALEXin (KEFLEX) 500 MG capsule    Sig: Take 1 capsule (500 mg total) by mouth 2 (two) times daily for 10 days. Take with food.    Dispense:  20 capsule    Refill:  0    Return in 19 days (on 07/16/2022) for previously scheduled, CPE, Haelyn Forgey PCP.   Total time spent:30 Minutes Time spent includes review of chart, medications, test results, and follow up plan with the  patient.   [place] Controlled Substance Database was reviewed by me.  This patient was seen by Jonetta Osgood, FNP-C in collaboration with Dr. Clayborn Bigness as a part of collaborative care agreement.   Denni France R. Valetta Fuller, MSN, FNP-C Internal medicine

## 2022-06-28 ENCOUNTER — Telehealth: Payer: Self-pay | Admitting: Nurse Practitioner

## 2022-06-28 NOTE — Telephone Encounter (Signed)
Lvm regarding u/s & referral-Toni

## 2022-06-28 NOTE — Telephone Encounter (Signed)
Awaiting 06/27/22 office notes for Rheumatology referral-Toni

## 2022-07-08 ENCOUNTER — Encounter: Payer: Self-pay | Admitting: Nurse Practitioner

## 2022-07-08 LAB — POCT RAPID STREP A (OFFICE): Rapid Strep A Screen: NEGATIVE

## 2022-07-09 ENCOUNTER — Telehealth: Payer: Self-pay | Admitting: Nurse Practitioner

## 2022-07-09 ENCOUNTER — Other Ambulatory Visit: Payer: Self-pay | Admitting: Nurse Practitioner

## 2022-07-09 ENCOUNTER — Ambulatory Visit: Payer: Medicaid Other

## 2022-07-09 DIAGNOSIS — F32A Depression, unspecified: Secondary | ICD-10-CM

## 2022-07-09 NOTE — Telephone Encounter (Signed)
Rheumatology referral faxed to [facility] per patient request ; [phone]

## 2022-07-09 NOTE — Telephone Encounter (Signed)
Only for 30 days for its new med

## 2022-07-16 ENCOUNTER — Encounter: Payer: Self-pay | Admitting: Nurse Practitioner

## 2022-07-16 ENCOUNTER — Ambulatory Visit (INDEPENDENT_AMBULATORY_CARE_PROVIDER_SITE_OTHER): Payer: Medicaid Other | Admitting: Nurse Practitioner

## 2022-07-16 ENCOUNTER — Ambulatory Visit
Admission: RE | Admit: 2022-07-16 | Discharge: 2022-07-16 | Disposition: A | Discharge status: Home / Self Care | Payer: Medicaid Other | Source: Ambulatory Visit | Attending: Nurse Practitioner | Admitting: Nurse Practitioner

## 2022-07-16 VITALS — BP 133/63 | HR 77 | Temp 98.6°F | Resp 16 | Ht <= 58 in | Wt 93.4 lb

## 2022-07-16 DIAGNOSIS — R1310 Dysphagia, unspecified: Secondary | ICD-10-CM

## 2022-07-16 DIAGNOSIS — Z0001 Encounter for general adult medical examination with abnormal findings: Secondary | ICD-10-CM

## 2022-07-16 DIAGNOSIS — F324 Major depressive disorder, single episode, in partial remission: Secondary | ICD-10-CM

## 2022-07-16 DIAGNOSIS — R946 Abnormal results of thyroid function studies: Secondary | ICD-10-CM | POA: Insufficient documentation

## 2022-07-16 DIAGNOSIS — F4310 Post-traumatic stress disorder, unspecified: Secondary | ICD-10-CM | POA: Diagnosis not present

## 2022-07-16 DIAGNOSIS — F09 Unspecified mental disorder due to known physiological condition: Secondary | ICD-10-CM | POA: Diagnosis not present

## 2022-07-16 DIAGNOSIS — M1380 Other specified arthritis, unspecified site: Secondary | ICD-10-CM

## 2022-07-16 DIAGNOSIS — F515 Nightmare disorder: Secondary | ICD-10-CM

## 2022-07-16 DIAGNOSIS — F4312 Post-traumatic stress disorder, chronic: Secondary | ICD-10-CM

## 2022-07-16 MED ORDER — MELOXICAM 15 MG PO TABS: 15.0000 mg | ORAL_TABLET | Freq: Every day | ORAL | 1 refills | Status: DC

## 2022-07-16 MED ORDER — DULOXETINE HCL 30 MG PO CPEP: 30.0000 mg | ORAL_CAPSULE | Freq: Every day | ORAL | 3 refills | Status: DC
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
## 2022-07-17 ENCOUNTER — Encounter: Payer: Self-pay | Admitting: Nurse Practitioner

## 2022-07-17 MED ORDER — PRAZOSIN HCL 1 MG PO CAPS: 1.0000 mg | ORAL_CAPSULE | Freq: Every day | ORAL | 2 refills | Status: DC

## 2022-07-19 ENCOUNTER — Telehealth: Payer: Self-pay | Admitting: Nurse Practitioner

## 2022-07-19 NOTE — Telephone Encounter (Signed)
[facility] referral faxed [place] in [place]; [phone]

## 2022-07-19 NOTE — Telephone Encounter (Signed)
Otolaryngology referral sent via [facility] to [facility]

## 2022-07-22 ENCOUNTER — Telehealth: Payer: Self-pay | Admitting: Nurse Practitioner

## 2022-07-22 NOTE — Telephone Encounter (Signed)
Otolaryngology appointment>> 08/27/22 with [facility]

## 2022-08-01 ENCOUNTER — Encounter: Payer: Self-pay | Admitting: Nurse Practitioner

## 2022-08-07 ENCOUNTER — Other Ambulatory Visit: Payer: Self-pay | Admitting: Nurse Practitioner

## 2022-08-07 DIAGNOSIS — M1380 Other specified arthritis, unspecified site: Secondary | ICD-10-CM

## 2022-08-07 DIAGNOSIS — F324 Major depressive disorder, single episode, in partial remission: Secondary | ICD-10-CM

## 2022-08-13 ENCOUNTER — Telehealth: Payer: Self-pay | Admitting: Nurse Practitioner

## 2022-08-13 NOTE — Telephone Encounter (Signed)
[facility] unable to get patient in until 08/01/2023. Rheumatology referral faxed to [facility] ; [phone]

## 2022-08-19 ENCOUNTER — Ambulatory Visit (INDEPENDENT_AMBULATORY_CARE_PROVIDER_SITE_OTHER): Payer: Medicaid Other | Admitting: Nurse Practitioner

## 2022-08-19 ENCOUNTER — Encounter: Payer: Self-pay | Admitting: Nurse Practitioner

## 2022-08-19 VITALS — BP 115/56 | HR 72 | Temp 98.7°F | Resp 16 | Ht <= 58 in | Wt 93.2 lb

## 2022-08-19 DIAGNOSIS — G479 Sleep disorder, unspecified: Secondary | ICD-10-CM

## 2022-08-19 DIAGNOSIS — F515 Nightmare disorder: Secondary | ICD-10-CM | POA: Diagnosis not present

## 2022-08-19 DIAGNOSIS — F324 Major depressive disorder, single episode, in partial remission: Secondary | ICD-10-CM

## 2022-08-19 DIAGNOSIS — M1380 Other specified arthritis, unspecified site: Secondary | ICD-10-CM

## 2022-08-19 DIAGNOSIS — F4312 Post-traumatic stress disorder, chronic: Secondary | ICD-10-CM

## 2022-08-19 MED ORDER — DULOXETINE HCL 30 MG PO CPEP: 30.0000 mg | ORAL_CAPSULE | Freq: Every day | ORAL | 3 refills | Status: DC

## 2022-08-19 MED ORDER — MELOXICAM 15 MG PO TABS: 15.0000 mg | ORAL_TABLET | Freq: Every day | ORAL | 1 refills | Status: DC

## 2022-08-19 NOTE — Progress Notes (Signed)
[facility] [address] [place], [place] [address]  Internal MEDICINE  Office Visit Note  Patient Name: Alexis CleverSophia R Williams  [id number] 17, 1992  [id number]  Date of Service: 08/19/2022  Chief Complaint  Patient presents with   Follow-up   Depression    HPI Regnia presents for a follow-up visit for anxiety, joint pain, trouble sleeping. Anxiety --doing better on increased dose of duloxetine. Has not had anymore panic attacks. Waiting to hear from a therapist to be scheduled.  Joint pains/arthritis -- meloxicam is helping. [facility] next [facility]. Wants to see if there is a specialist that can see her sooner.  Nightmares and flashbacks -- started taking prazosin but was having elevated heart rate and palpitations and said it made her feel "hollow".  Having trouble sleeping -- sleep hygiene discussed. Has tried melatonin OTC but has not tried any other OTC medications      Current Medication: Outpatient Encounter Medications as of 08/19/2022  Medication Sig   Vitamin D, Ergocalciferol, (DRISDOL) 1.25 MG (50000 UNIT) CAPS capsule Take 1 capsule (50,000 Units total) by mouth every 7 (seven) days.   [DISCONTINUED] DULoxetine (CYMBALTA) 30 MG capsule Take 1 capsule (30 mg total) by mouth daily.   [DISCONTINUED] meloxicam (MOBIC) 15 MG tablet Take 1 tablet (15 mg total) by mouth daily. In am with breakfast   [DISCONTINUED] prazosin (MINIPRESS) 1 MG capsule Take 1 capsule (1 mg total) by mouth at bedtime.   DULoxetine (CYMBALTA) 30 MG capsule Take 1 capsule (30 mg total) by mouth daily.   meloxicam (MOBIC) 15 MG tablet Take 1 tablet (15 mg total) by mouth daily. In am with breakfast   No facility-administered encounter medications on file as of 08/19/2022.    Surgical History: Past Surgical History:  Procedure Laterality Date   LEG SURGERY  2005   OSTECTOMY  2002   spinal infusion  2010,2012    Medical History: Past Medical History:  Diagnosis  Date   Arthritis    Depression    Lupus     Family History: Family History  Problem Relation Age of Onset   Lung cancer Maternal Grandmother    COPD Maternal Grandmother     Social History   Socioeconomic History   Marital status: Single    Spouse name: Not on file   Number of children: Not on file   Years of education: Not on file   Highest education level: Not on file  Occupational History   Not on file  Tobacco Use   Smoking status: Never   Smokeless tobacco: Never  Substance and Sexual Activity   Alcohol use: Never   Drug use: Never   Sexual activity: Yes  Other Topics Concern   Not on file  Social History Narrative   Not on file   Social Determinants of Health   Financial Resource Strain: Not on file  Food Insecurity: Not on file  Transportation Needs: Not on file  Physical Activity: Not on file  Stress: Not on file  Social Connections: Not on file  Intimate Partner Violence: Not on file      Review of Systems  Constitutional:  Positive for fatigue. Negative for chills and unexpected weight change.  HENT:  Positive for sore throat. Negative for congestion, postnasal drip, rhinorrhea and sneezing.   Eyes:  Negative for redness.  Respiratory:  Negative for cough, chest tightness, shortness of breath and wheezing.   Cardiovascular: Negative.  Negative for chest pain and palpitations.  Gastrointestinal: Negative.  Negative for abdominal pain, constipation, diarrhea, nausea and vomiting.  Musculoskeletal:  Positive for arthralgias and back pain. Negative for joint swelling and neck pain.  Skin:  Negative for rash.  Neurological: Negative.  Negative for tremors and numbness.  Hematological:  Negative for adenopathy. Does not bruise/bleed easily.  Psychiatric/Behavioral:  Positive for behavioral problems (Depression) and sleep disturbance. Negative for self-injury and suicidal ideas. The patient is nervous/anxious.     Vital Signs: BP (!) 115/56   Pulse  72   Temp 98.7 F (37.1 C)   Resp 16   Ht 4\' 2"  (1.27 m)   Wt 93 lb 3.2 oz (42.3 kg)   SpO2 98%   BMI 26.21 kg/m    Physical Exam Vitals reviewed.  Constitutional:      General: She is not in acute distress.    Appearance: Normal appearance. She is normal weight. She is not ill-appearing.  HENT:     Head: Normocephalic and atraumatic.     Mouth/Throat:     Mouth: Mucous membranes are moist.  Eyes:     Pupils: Pupils are equal, round, and reactive to light.  Cardiovascular:     Rate and Rhythm: Normal rate and regular rhythm.  Pulmonary:     Effort: Pulmonary effort is normal. No respiratory distress.  Neurological:     Mental Status: She is alert and oriented to person, place, and time.  Psychiatric:        Mood and Affect: Mood normal.        Behavior: Behavior normal.       Assessment/Plan: 1. Seronegative inflammatory arthritis Continue duloxetine as prescribed.  Continue meloxicam as prescribed.  - DULoxetine (CYMBALTA) 30 MG capsule; Take 1 capsule (30 mg total) by mouth daily.  Dispense: 30 capsule; Refill: 3 - meloxicam (MOBIC) 15 MG tablet; Take 1 tablet (15 mg total) by mouth daily. In am with breakfast  Dispense: 30 tablet; Refill: 1  2. Sleep disturbance Try OTC sleep medications discussed as well as good sleep hygiene practices.   3. Depression, major, single episode, in partial remission Continue duloxetine as prescribed.  - DULoxetine (CYMBALTA) 30 MG capsule; Take 1 capsule (30 mg total) by mouth daily.  Dispense: 30 capsule; Refill: 3  4. Nightmares associated with chronic post-traumatic stress disorder Tried prazosin but experienced adverse side effects. Discontinue prazosin   General Counseling: Clariza verbalizes understanding of the findings of todays visit and agrees with plan of treatment. I have discussed any further diagnostic evaluation that may be needed or ordered today. We also reviewed her medications today. she has been encouraged to  call the office with any questions or concerns that should arise related to todays visit.    No orders of the defined types were placed in this encounter.   Meds ordered this encounter  Medications   DULoxetine (CYMBALTA) 30 MG capsule    Sig: Take 1 capsule (30 mg total) by mouth daily.    Dispense:  30 capsule    Refill:  3   meloxicam (MOBIC) 15 MG tablet    Sig: Take 1 tablet (15 mg total) by mouth daily. In am with breakfast    Dispense:  30 tablet    Refill:  1    Return in about 2 months (around 10/19/2022) for F/U, Francelia Mclaren PCP.   Total time spent:30 Minutes Time spent includes review of chart, medications, test results, and follow up plan with the patient.   [place] Controlled Substance Database was  reviewed by me.  This patient was seen by Sallyanne Kuster, FNP-C in collaboration with Dr. Beverely Risen as a part of collaborative care agreement.   Excell Neyland R. Tedd Sias, MSN, FNP-C Internal medicine

## 2022-08-23 ENCOUNTER — Telehealth: Payer: Self-pay

## 2022-08-23 DIAGNOSIS — M064 Inflammatory polyarthropathy: Secondary | ICD-10-CM

## 2022-08-23 DIAGNOSIS — M1380 Other specified arthritis, unspecified site: Secondary | ICD-10-CM

## 2022-08-25 ENCOUNTER — Encounter: Payer: Self-pay | Admitting: Nurse Practitioner

## 2022-08-26 MED ORDER — DICLOFENAC SODIUM 25 MG PO TBEC: 25.0000 mg | DELAYED_RELEASE_TABLET | Freq: Two times a day (BID) | ORAL | 2 refills | Status: DC

## 2022-08-26 NOTE — Telephone Encounter (Signed)
Spoke with pt that we send med

## 2022-08-26 NOTE — Telephone Encounter (Signed)
Also as per pt she was having allergies symptoms she took OTC all symptoms she think is not med side effects gone advised since she already stopped meloxicam she can start this med alyssa send today

## 2022-09-05 NOTE — Addendum Note (Signed)
Addended by: Sallyanne Kuster on: 09/05/2022 09:39 AM   Modules accepted: Orders

## 2022-10-21 ENCOUNTER — Ambulatory Visit: Payer: Medicaid Other | Admitting: Nurse Practitioner

## 2022-10-30 ENCOUNTER — Ambulatory Visit: Payer: Medicaid Other | Admitting: Nurse Practitioner

## 2023-01-01 ENCOUNTER — Ambulatory Visit: Payer: Medicaid Other | Admitting: Gastroenterology

## 2023-01-02 ENCOUNTER — Encounter: Payer: Self-pay | Admitting: Unknown Physician Specialty

## 2023-01-02 NOTE — Anesthesia Preprocedure Evaluation (Addendum)
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  Large forehead, short maxilla, large mandible, depressed nasal bridge and macroglossia may present challenge with mask ventilation.  Dental no notable dental hx.    Pulmonary neg pulmonary ROS   Pulmonary exam normal breath sounds clear to auscultation       Cardiovascular negative cardio ROS Normal cardiovascular exam Rhythm:Regular Rate:Normal     Neuro/Psych  PSYCHIATRIC DISORDERS Anxiety Depression    negative neurological ROS  negative psych ROS   GI/Hepatic negative GI ROS, Neg liver ROS,,,  Endo/Other  negative endocrine ROS    Renal/GU negative Renal ROS  negative genitourinary   Musculoskeletal negative musculoskeletal ROS (+) Arthritis ,    Abdominal   Peds negative pediatric ROS (+)  Hematology negative hematology ROS (+)   Anesthesia Other Findings Arthritis  Depression Lupus (HCC)  Anxiety PTSD Polyarthralgia achondroplasia   Reproductive/Obstetrics negative OB ROS                             Anesthesia Physical Anesthesia Plan  ASA: 3  Anesthesia Plan: General   Post-op Pain Management:    Induction: Intravenous  PONV Risk Score and Plan:   Airway Management Planned: Natural Airway and Nasal Cannula  Additional Equipment:   Intra-op  Plan:   Post-operative Plan:   Informed Consent: I have reviewed the patients History and Physical, chart, labs and discussed the procedure including the risks, benefits and alternatives for the proposed anesthesia with the patient or authorized representative who has indicated his/her understanding and acceptance.     Dental Advisory Given  Plan Discussed with: Anesthesiologist, CRNA and Surgeon  Anesthesia Plan Comments: (Patient consented for risks of anesthesia including but not limited to:  - adverse reactions to medications - risk of airway placement if required - damage to eyes, teeth, lips or other oral mucosa - nerve damage due to positioning  - sore throat or hoarseness - Damage to heart, brain, nerves, lungs, other parts of body or loss of life  Patient voiced understanding.)        Anesthesia Quick Evaluation

## 2023-01-03 ENCOUNTER — Other Ambulatory Visit: Payer: Self-pay

## 2023-01-03 MED ORDER — CIPROFLOXACIN-DEXAMETHASONE 0.3-0.1 % OT SUSP
4.0000 [drp] | Freq: Two times a day (BID) | OTIC | 0 refills | Status: DC
  Filled 2023-01-03: qty 7.5, 5d supply, fill #0

## 2023-01-07 NOTE — Discharge Instructions (Signed)
[facility] DISCHARGE INSTRUCTIONS FOR MYRINGOTOMY AND TUBE INSERTION  [facility] CHAPMAN T. MCQUEEN, M.D.   Diet:   After surgery, the patient should take only liquids and foods as tolerated.  The patient may then have a regular diet after the effects of anesthesia have worn off, usually about four to six hours after surgery.  Activities:   The patient should rest until the effects of anesthesia have worn off.  After this, there are no restrictions on the normal daily activities.  Medications:   You will be given a prescription for antibiotic drops to be used in the ears postoperatively.  It is recommended to use 4 drops 2 times a day for 7 days, then the drops should be saved for possible future use.  The tubes should not cause any discomfort to the patient, but if there is any question, Tylenol should be given according to the instructions for the age of the patient.  Other medications should be continued normally.  Precautions:   Should there be recurrent drainage after the tubes are placed, the drops should be used for approximately 3-4 days.  If it does not clear, you should call the ENT office.  Earplugs:   Earplugs are only needed for those who are going to be submerged under water.  When taking a bath or shower and using a cup or showerhead to rinse hair, it is not necessary to wear earplugs.  These come in a variety of fashions, all of which can be obtained at our office.  However, if one is not able to come by the office, then silicone plugs can be found at most pharmacies.  It is not advised to stick anything in the ear that is not approved as an earplug.  Silly putty is not to be used as an earplug.  Swimming is allowed in patients after ear tubes are inserted, however, they must wear earplugs if they are going to be submerged under water.  For those children who are going to be swimming a lot, it is recommended to use a fitted ear mold, which can be  made by our audiologist.  If discharge is noticed from the ears, this most likely represents an ear infection.  We would recommend getting your eardrops and using them as indicated above.  If it does not clear, then you should call the ENT office.  For follow up, the patient should return to the ENT office three weeks postoperatively and then every six months as required by the doctor. 

## 2023-01-10 ENCOUNTER — Ambulatory Visit: Payer: Medicaid Other | Admitting: Anesthesiology

## 2023-01-10 ENCOUNTER — Ambulatory Visit
Admission: RE | Admit: 2023-01-10 | Discharge: 2023-01-10 | Disposition: A | Discharge status: Home / Self Care | Payer: Medicaid Other | Attending: Unknown Physician Specialty | Admitting: Unknown Physician Specialty

## 2023-01-10 ENCOUNTER — Encounter: Payer: Self-pay | Admitting: Unknown Physician Specialty

## 2023-01-10 ENCOUNTER — Other Ambulatory Visit: Payer: Self-pay

## 2023-01-10 ENCOUNTER — Encounter
Admission: RE | Disposition: A | Discharge status: Home / Self Care | Payer: Self-pay | Source: Home / Self Care | Attending: Unknown Physician Specialty

## 2023-01-10 DIAGNOSIS — H663X3 Other chronic suppurative otitis media, bilateral: Secondary | ICD-10-CM | POA: Diagnosis not present

## 2023-01-10 DIAGNOSIS — H6983 Other specified disorders of Eustachian tube, bilateral: Secondary | ICD-10-CM | POA: Insufficient documentation

## 2023-01-10 HISTORY — DX: Achondroplasia: Q77.4

## 2023-01-10 HISTORY — DX: Anxiety disorder, unspecified: F41.9

## 2023-01-10 HISTORY — DX: Post-traumatic stress disorder, unspecified: F43.10

## 2023-01-10 HISTORY — PX: MYRINGOTOMY WITH TUBE PLACEMENT: SHX5663

## 2023-01-10 HISTORY — DX: Nausea with vomiting, unspecified: Z98.890

## 2023-01-10 HISTORY — DX: Pain in unspecified joint: M25.50

## 2023-01-10 LAB — POCT PREGNANCY, URINE: Preg Test, Ur: NEGATIVE

## 2023-01-10 SURGERY — MYRINGOTOMY WITH TUBE PLACEMENT
Anesthesia: General | Site: Ear | Laterality: Bilateral

## 2023-01-10 MED ORDER — ONDANSETRON HCL 4 MG/2ML IJ SOLN
INTRAMUSCULAR | Status: DC | PRN
  Administered 2023-01-10: 4 mg via INTRAVENOUS

## 2023-01-10 MED ORDER — CIPROFLOXACIN-DEXAMETHASONE 0.3-0.1 % OT SUSP
OTIC | Status: DC | PRN
  Administered 2023-01-10: 4 [drp] via OTIC

## 2023-01-10 MED ORDER — PROPOFOL 500 MG/50ML IV EMUL
INTRAVENOUS | Status: DC | PRN
  Administered 2023-01-10: 100 mg via INTRAVENOUS

## 2023-01-10 MED ORDER — LACTATED RINGERS IV SOLN: INTRAVENOUS | Status: DC

## 2023-01-10 MED ORDER — MIDAZOLAM HCL 2 MG/2ML IJ SOLN
INTRAMUSCULAR | Status: DC | PRN
  Administered 2023-01-10: 2 mg via INTRAVENOUS

## 2023-01-10 SURGICAL SUPPLY — 9 items
BALL CTTN LRG ABS STRL LF (GAUZE/BANDAGES/DRESSINGS) ×1
BLADE MYR LANCE NRW W/HDL (BLADE) ×1 IMPLANT
CANISTER SUCT 1200ML W/VALVE (MISCELLANEOUS) ×1 IMPLANT
COTTONBALL LRG STERILE PKG (GAUZE/BANDAGES/DRESSINGS) ×1 IMPLANT
GLOVE SURG ENC TEXT LTX SZ7.5 (GLOVE) ×1 IMPLANT
STRAP BODY AND KNEE 60X3 (MISCELLANEOUS) ×1 IMPLANT
TOWEL OR 17X26 4PK STRL BLUE (TOWEL DISPOSABLE) ×1 IMPLANT
TUBE GRMT FLRPLST BEV 1.14 (OTOLOGIC RELATED) ×1 IMPLANT
TUBING SUCTION CONN 0.25 STRL (TUBING) ×1 IMPLANT

## 2023-01-10 NOTE — H&P (Signed)
The patient's history has been reviewed, patient examined, no change in status, stable for surgery.  Questions were answered to the patients satisfaction.  

## 2023-01-10 NOTE — Transfer of Care (Signed)
Immediate Anesthesia Transfer of Care Note  Patient: Alexis Williams  Procedure(s) Performed: MYRINGOTOMY WITH TUBE PLACEMENT (Bilateral: Ear)  Patient Location: PACU  Anesthesia Type: General  Level of Consciousness: awake, alert  and patient cooperative  Airway and Oxygen Therapy: Patient Spontanous Breathing and Patient connected to supplemental oxygen  Post-op Assessment: Post-op Vital signs reviewed, Patient's Cardiovascular Status Stable, Respiratory Function Stable, Patent Airway and No signs of Nausea or vomiting  Post-op Vital Signs: Reviewed and stable  Complications: No notable events documented.

## 2023-01-10 NOTE — Op Note (Signed)
01/10/2023   9:04 AM    Alexis Williams  [id number]   Pre-Op Dx: Junita Push tube dysfunction  Post-op Dx: SAME  Proc: Bilateral myringotomy and T tube placement  Surg:  Davina Poke  Anes:  GOT  EBL: 0  Comp: None  Findings: Right side tympanic membrane scarring with mucoid effusion, left side severe posterior atelectasis with squamous debris in the posterior pocket mucoid otitis media on the left as well  Procedure: [name] was identified in the holding area taken the operating room placed in supine position.  After general mask anesthesia the operating microscope was brought in the field.  Examination the right ear showed significant tympanic membrane scarring.  An inferior myringotomy was performed there was mucoid effusion within the middle ear space which was suctioned free.  A T-tube was then placed in the myringotomy followed by Ciprodex drops.  The left ear was then examined.  There is severe posterior atelectasis.  There was a retraction pocket posteriorly which was retracted posteriorly.  There was squamous debris within this pocket which was removed using alligator forcep.  The suction was then used to gently lift out the tympanic membrane from the severe retraction pocket.  An inferior myringotomy was performed there was mucoid effusion in the middle ear space which was suctioned free.  The T-tube was then placed in this myringotomy followed by Ciprodex drops.  The patient was then awakened in the operating room taken recovery room in stable condition.  Dispo:   Good  Plan: Discharge to home will follow-up in 3 weeks and continue to follow the severe retraction pocket on the left.  Davina Poke  01/10/2023 9:04 AM

## 2023-01-10 NOTE — Anesthesia Postprocedure Evaluation (Signed)
Anesthesia Post Note  Patient: Jayel R Hittle  Procedure(s) Performed: MYRINGOTOMY WITH TUBE PLACEMENT (Bilateral: Ear)  Patient location during evaluation: PACU Anesthesia Type: General Level of consciousness: awake and alert Pain management: pain level controlled Vital Signs Assessment: post-procedure vital signs reviewed and stable Respiratory status: spontaneous breathing, nonlabored ventilation, respiratory function stable and patient connected to nasal cannula oxygen Cardiovascular status: blood pressure returned to baseline and stable Postop Assessment: no apparent nausea or vomiting Anesthetic complications: no   No notable events documented.   Last Vitals:  Vitals:   01/10/23 0930 01/10/23 0945  BP: 118/81 (!) 134/90  Pulse: 73 80  Resp: 14 16  Temp:  36.6 C  SpO2: 100% 100%    Last Pain:  Vitals:   01/10/23 0945  TempSrc:   PainSc: 2                  Shonte Beutler C Aydan Phoenix

## 2023-01-27 ENCOUNTER — Telehealth: Payer: Self-pay

## 2023-01-27 ENCOUNTER — Encounter: Payer: Self-pay | Admitting: Nurse Practitioner

## 2023-01-28 ENCOUNTER — Encounter: Payer: Self-pay | Admitting: Nurse Practitioner

## 2023-01-28 ENCOUNTER — Ambulatory Visit: Payer: Medicaid Other | Admitting: Nurse Practitioner

## 2023-01-28 VITALS — BP 121/77 | HR 95 | Temp 98.7°F | Resp 16 | Ht <= 58 in | Wt 88.8 lb

## 2023-01-28 DIAGNOSIS — G479 Sleep disorder, unspecified: Secondary | ICD-10-CM

## 2023-01-28 DIAGNOSIS — F410 Panic disorder [episodic paroxysmal anxiety] without agoraphobia: Secondary | ICD-10-CM

## 2023-01-28 DIAGNOSIS — F324 Major depressive disorder, single episode, in partial remission: Secondary | ICD-10-CM

## 2023-01-28 MED ORDER — ALPRAZOLAM 0.25 MG PO TABS: 0.2500 mg | ORAL_TABLET | Freq: Two times a day (BID) | ORAL | 2 refills | Status: DC | PRN

## 2023-01-28 MED ORDER — BELSOMRA 10 MG PO TABS
10.0000 mg | ORAL_TABLET | Freq: Every evening | ORAL | 2 refills | Status: AC | PRN
Start: 2023-01-28 — End: ?
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
## 2023-01-28 NOTE — Telephone Encounter (Signed)
Sent message to alyssa and she already get message from pt that will talk to her today

## 2023-02-05 MED ORDER — TRAZODONE HCL 50 MG PO TABS: 50.0000 mg | ORAL_TABLET | Freq: Every day | ORAL | 2 refills | Status: DC

## 2023-02-14 ENCOUNTER — Other Ambulatory Visit: Payer: Self-pay | Admitting: Nurse Practitioner

## 2023-02-14 MED ORDER — ALPRAZOLAM 0.25 MG PO TABS: 0.2500 mg | ORAL_TABLET | Freq: Three times a day (TID) | ORAL | 2 refills | Status: DC | PRN

## 2023-02-19 ENCOUNTER — Encounter: Payer: Self-pay | Admitting: Nurse Practitioner

## 2023-02-19 ENCOUNTER — Ambulatory Visit: Payer: Medicaid Other | Admitting: Nurse Practitioner

## 2023-02-19 VITALS — BP 102/61 | HR 75 | Temp 98.6°F | Resp 16 | Ht <= 58 in | Wt 88.2 lb

## 2023-02-19 DIAGNOSIS — F410 Panic disorder [episodic paroxysmal anxiety] without agoraphobia: Secondary | ICD-10-CM

## 2023-02-19 DIAGNOSIS — F324 Major depressive disorder, single episode, in partial remission: Secondary | ICD-10-CM

## 2023-02-19 DIAGNOSIS — F4312 Post-traumatic stress disorder, chronic: Secondary | ICD-10-CM

## 2023-02-19 DIAGNOSIS — G479 Sleep disorder, unspecified: Secondary | ICD-10-CM | POA: Diagnosis not present

## 2023-02-19 DIAGNOSIS — F4310 Post-traumatic stress disorder, unspecified: Secondary | ICD-10-CM

## 2023-02-19 DIAGNOSIS — F515 Nightmare disorder: Secondary | ICD-10-CM

## 2023-02-19 DIAGNOSIS — Z3009 Encounter for other general counseling and advice on contraception: Secondary | ICD-10-CM

## 2023-02-19 DIAGNOSIS — F09 Unspecified mental disorder due to known physiological condition: Secondary | ICD-10-CM

## 2023-02-19 DIAGNOSIS — Q774 Achondroplasia: Secondary | ICD-10-CM

## 2023-02-19 MED ORDER — ALPRAZOLAM 0.5 MG PO TABS
0.5000 mg | ORAL_TABLET | Freq: Three times a day (TID) | ORAL | 2 refills | Status: DC | PRN
Start: 2023-02-19 — End: 2023-05-16

## 2023-02-19 MED ORDER — TRAZODONE HCL 50 MG PO TABS: 50.0000 mg | ORAL_TABLET | Freq: Every day | ORAL | 2 refills | Status: DC
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
## 2023-03-12 ENCOUNTER — Telehealth: Payer: Self-pay

## 2023-03-12 NOTE — Telephone Encounter (Signed)
Pt called that her xanax get contaminated with paint advised her to call [facility]

## 2023-03-18 ENCOUNTER — Encounter: Payer: Self-pay | Admitting: Nurse Practitioner

## 2023-03-18 ENCOUNTER — Telehealth: Payer: Medicaid Other | Admitting: Nurse Practitioner

## 2023-03-18 VITALS — Resp 16 | Ht <= 58 in | Wt 89.0 lb

## 2023-03-18 DIAGNOSIS — J0110 Acute frontal sinusitis, unspecified: Secondary | ICD-10-CM

## 2023-03-18 MED ORDER — DOXYCYCLINE HYCLATE 100 MG PO TABS
100.0000 mg | ORAL_TABLET | Freq: Two times a day (BID) | ORAL | 0 refills | Status: DC
Start: 2023-03-18 — End: 2023-05-02

## 2023-03-18 NOTE — Progress Notes (Signed)
[facility] [address] [place], [place] [address]  Internal MEDICINE  Telephone Visit  Patient Name: Alexis Williams  [id number]  [id number]  Date of Service: 03/18/2023  I connected with the patient at 1310 by telephone and verified the patients identity using two identifiers.   I discussed the limitations, risks, security and privacy concerns of performing an evaluation and management service by telephone and the availability of in person appointments. I also discussed with the patient that there may be a patient responsible charge related to the service.  The patient expressed understanding and agrees to proceed.    Chief Complaint  Patient presents with   Telephone Screen    Allergies, itchy eyes, headache, coughing tongue tingling, no energy.    Telephone Assessment    HPI Kiesha presents for a telehealth virtual visit for possible upperrespira Was allergies Now chest congestion, cough, fatigue, watery itchy eyes, sinus pressure, feverish, chills, headache.  Negative for covid    Current Medication: Outpatient Encounter Medications as of 03/18/2023  Medication Sig   ALPRAZolam (XANAX) 0.5 MG tablet Take 1 tablet (0.5 mg total) by mouth 3 (three) times daily as needed for anxiety.   Ashwagandha (ASHWAGANDHA 35) 120 MG CAPS Take 0.5 mcL by mouth 1 day or 1 dose.   doxycycline (VIBRA-TABS) 100 MG tablet Take 1 tablet (100 mg total) by mouth 2 (two) times daily. Take with food.   traZODone (DESYREL) 50 MG tablet Take 1-2 tablets (50-100 mg total) by mouth at bedtime.   No facility-administered encounter medications on file as of 03/18/2023.    Surgical History: Past Surgical History:  Procedure Laterality Date   LEG SURGERY  2005   MYRINGOTOMY WITH TUBE PLACEMENT Bilateral 01/10/2023   Procedure: MYRINGOTOMY WITH TUBE PLACEMENT;  Surgeon: Linus Salmons, MD;  Location: [facility];  Service: ENT;  Laterality: Bilateral;   OSTECTOMY  2002   spinal  infusion  2010,2012    Medical History: Past Medical History:  Diagnosis Date   Achondroplasia    Anxiety    Arthritis    Depression    Lupus    Polyarthralgia    PONV (postoperative nausea and vomiting)    PTSD (post-traumatic stress disorder)     Family History: Family History  Problem Relation Age of Onset   Lung cancer Maternal Grandmother    COPD Maternal Grandmother     Social History   Socioeconomic History   Marital status: Single    Spouse name: Not on file   Number of children: Not on file   Years of education: Not on file   Highest education level: Not on file  Occupational History   Not on file  Tobacco Use   Smoking status: Never   Smokeless tobacco: Never  Substance and Sexual Activity   Alcohol use: Never   Drug use: Never   Sexual activity: Yes  Other Topics Concern   Not on file  Social History Narrative   Not on file   Social Determinants of Health   Financial Resource Strain: Not on file  Food Insecurity: Not on file  Transportation Needs: Not on file  Physical Activity: Not on file  Stress: Not on file  Social Connections: Not on file  Intimate Partner Violence: Not on file      Review of Systems  Constitutional:  Positive for appetite change, chills, fatigue and fever.  HENT:  Positive for congestion, postnasal drip, rhinorrhea, sinus pressure and sinus pain.   Respiratory:  Positive for cough, chest tightness and shortness of breath. Negative for wheezing.   Cardiovascular: Negative.  Negative for chest pain and palpitations.  Gastrointestinal:  Positive for diarrhea and nausea. Negative for vomiting.  Neurological:  Positive for headaches.    Vital Signs: Resp 16   Ht 4\' 2"  (1.27 m)   Wt 89 lb (40.4 kg)   BMI 25.03 kg/m    Observation/Objective: She is alert and oriented. No acute distress noted.    Assessment/Plan: 1. Acute non-recurrent frontal sinusitis Take doxycycline as prescribed, until gone. Take with  food. Call clinic if no improvement or worsening of symptoms after at least 3 days of the medication.  - doxycycline (VIBRA-TABS) 100 MG tablet; Take 1 tablet (100 mg total) by mouth 2 (two) times daily. Take with food.  Dispense: 20 tablet; Refill: 0   General Counseling: Indea verbalizes understanding of the findings of today's phone visit and agrees with plan of treatment. I have discussed any further diagnostic evaluation that may be needed or ordered today. We also reviewed her medications today. she has been encouraged to call the office with any questions or concerns that should arise related to todays visit.  Return if symptoms worsen or fail to improve.   No orders of the defined types were placed in this encounter.   Meds ordered this encounter  Medications   doxycycline (VIBRA-TABS) 100 MG tablet    Sig: Take 1 tablet (100 mg total) by mouth 2 (two) times daily. Take with food.    Dispense:  20 tablet    Refill:  0    Time spent:10 Minutes Time spent with patient included reviewing progress notes, labs, imaging studies, and discussing plan for follow up.  [place] Controlled Substance Database was reviewed by me for overdose risk score (ORS) if appropriate.  This patient was seen by Sallyanne Kuster, FNP-C in collaboration with Dr. Beverely Risen as a part of collaborative care agreement.  Indalecio Malmstrom R. Tedd Sias, MSN, FNP-C Internal medicine

## 2023-04-04 ENCOUNTER — Telehealth: Payer: Self-pay | Admitting: Nurse Practitioner

## 2023-04-11 ENCOUNTER — Encounter: Payer: Self-pay | Admitting: Nurse Practitioner

## 2023-04-15 ENCOUNTER — Telehealth: Payer: Self-pay | Admitting: Nurse Practitioner

## 2023-04-15 NOTE — Telephone Encounter (Signed)
Psychology referral faxed to Buren Kos with [facility]; [phone]. Notified patient. Gave pt telephone [phone]

## 2023-04-15 NOTE — Telephone Encounter (Signed)
Sent email to Buren Kos to see if she handles EDMR therapy-Toni

## 2023-04-21 ENCOUNTER — Telehealth: Payer: Self-pay | Admitting: Nurse Practitioner

## 2023-04-21 NOTE — Telephone Encounter (Addendum)
Received email from [facility]. After reviewing office notes, they are not able to treat patient. Lvm with [facility] in [place] to return my call-Toni Lvm for patient to return my call regarding referral-Toni

## 2023-04-21 NOTE — Telephone Encounter (Signed)
error 

## 2023-04-22 ENCOUNTER — Ambulatory Visit: Payer: Medicaid Other | Admitting: Nurse Practitioner

## 2023-04-24 ENCOUNTER — Telehealth: Payer: Self-pay | Admitting: Nurse Practitioner

## 2023-04-24 NOTE — Telephone Encounter (Signed)
Emailed [facility] to see if they can treat [name]

## 2023-04-24 NOTE — Telephone Encounter (Signed)
BH referral faxed to [facility] with [facility]; [phone]

## 2023-05-02 ENCOUNTER — Encounter: Payer: Self-pay | Admitting: Obstetrics

## 2023-05-02 ENCOUNTER — Ambulatory Visit (INDEPENDENT_AMBULATORY_CARE_PROVIDER_SITE_OTHER): Payer: Medicaid Other | Admitting: Obstetrics

## 2023-05-02 VITALS — BP 118/76 | HR 73 | Ht <= 58 in | Wt 95.0 lb

## 2023-05-02 DIAGNOSIS — Z319 Encounter for procreative management, unspecified: Secondary | ICD-10-CM | POA: Diagnosis not present

## 2023-05-02 DIAGNOSIS — Z7689 Persons encountering health services in other specified circumstances: Secondary | ICD-10-CM
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
## 2023-05-16 ENCOUNTER — Encounter: Payer: Self-pay | Admitting: Nurse Practitioner

## 2023-05-16 ENCOUNTER — Ambulatory Visit (INDEPENDENT_AMBULATORY_CARE_PROVIDER_SITE_OTHER): Payer: Medicaid Other | Admitting: Nurse Practitioner

## 2023-05-16 VITALS — BP 109/74 | HR 86 | Temp 98.7°F | Resp 16 | Ht <= 58 in | Wt 93.0 lb

## 2023-05-16 DIAGNOSIS — F324 Major depressive disorder, single episode, in partial remission: Secondary | ICD-10-CM

## 2023-05-16 DIAGNOSIS — E559 Vitamin D deficiency, unspecified: Secondary | ICD-10-CM

## 2023-05-16 DIAGNOSIS — F410 Panic disorder [episodic paroxysmal anxiety] without agoraphobia: Secondary | ICD-10-CM | POA: Insufficient documentation

## 2023-05-16 DIAGNOSIS — E782 Mixed hyperlipidemia: Secondary | ICD-10-CM | POA: Diagnosis not present

## 2023-05-16 DIAGNOSIS — G479 Sleep disorder, unspecified: Secondary | ICD-10-CM | POA: Insufficient documentation

## 2023-05-16 DIAGNOSIS — E538 Deficiency of other specified B group vitamins: Secondary | ICD-10-CM

## 2023-05-16 DIAGNOSIS — M1380 Other specified arthritis, unspecified site: Secondary | ICD-10-CM

## 2023-05-16 MED ORDER — ALPRAZOLAM 0.5 MG PO TABS
0.5000 mg | ORAL_TABLET | Freq: Three times a day (TID) | ORAL | 2 refills | Status: DC | PRN
Start: 2023-05-16 — End: 2023-08-13

## 2023-05-16 NOTE — Progress Notes (Unsigned)
[facility] [address] [place], [place] [address]  Internal MEDICINE  Office Visit Note  Patient Name: [name]  [id number]  [id number]  Date of Service: 05/16/2023  Chief Complaint  Patient presents with  . Depression  . Follow-up    HPI Alexis Williams presents for a follow-up visit for  Arthritis -- still achy -- takes natural supplements and is doing meditation, yoga and pilates which helps.      Current Medication: Outpatient Encounter Medications as of 05/16/2023  Medication Sig  . Ashwagandha (ASHWAGANDHA 35) 120 MG CAPS Take 0.5 mcL by mouth 1 day or 1 dose.  . Cholecalciferol (VITAMIN D3 PO) Take by mouth.  . [DISCONTINUED] ALPRAZolam (XANAX) 0.5 MG tablet Take 1 tablet (0.5 mg total) by mouth 3 (three) times daily as needed for anxiety.  . ALPRAZolam (XANAX) 0.5 MG tablet Take 1 tablet (0.5 mg total) by mouth 3 (three) times daily as needed for anxiety.   No facility-administered encounter medications on file as of 05/16/2023.    Surgical History: Past Surgical History:  Procedure Laterality Date  . LEG SURGERY  2005  . MYRINGOTOMY WITH TUBE PLACEMENT Bilateral 01/10/2023   Procedure: MYRINGOTOMY WITH TUBE PLACEMENT;  Surgeon: Linus Salmons, MD;  Location: [facility];  Service: ENT;  Laterality: Bilateral;  . OSTECTOMY  2002  . spinal infusion  2010,2012    Medical History: Past Medical History:  Diagnosis Date  . Achondroplasia   . Anxiety   . Arthritis   . Depression   . Polyarthralgia   . PONV (postoperative nausea and vomiting)   . PTSD (post-traumatic stress disorder)     Family History: Family History  Problem Relation Age of Onset  . Thyroid cancer Mother 88  . Breast cancer Maternal Grandmother        30/40s  . Lung cancer Maternal Great-grandmother   . COPD Maternal Great-grandmother     Social History   Socioeconomic History  . Marital status: Single    Spouse name: Not on file  . Number of children:  Not on file  . Years of education: Not on file  . Highest education level: Not on file  Occupational History  . Not on file  Tobacco Use  . Smoking status: Never  . Smokeless tobacco: Never  Vaping Use  . Vaping status: Never Used  Substance and Sexual Activity  . Alcohol use: Never  . Drug use: Never  . Sexual activity: Not Currently    Birth control/protection: None  Other Topics Concern  . Not on file  Social History Narrative  . Not on file   Social Drivers of Health   Financial Resource Strain: Not on file  Food Insecurity: Not on file  Transportation Needs: Not on file  Physical Activity: Not on file  Stress: Not on file  Social Connections: Not on file  Intimate Partner Violence: Not on file      Review of Systems  Vital Signs: BP 109/74   Pulse 86   Temp 98.7 F (37.1 C)   Resp 16   Ht 4' 2.5" (1.283 m)   Wt 93 lb (42.2 kg)   LMP 04/08/2023   SpO2 98%   BMI 25.64 kg/m    Physical Exam     Assessment/Plan:   General Counseling: Manjit verbalizes understanding of the findings of todays visit and agrees with plan of treatment. I have discussed any further diagnostic evaluation that may be needed or ordered today. We also reviewed  her medications today. she has been encouraged to call the office with any questions or concerns that should arise related to todays visit.    Orders Placed This Encounter  Procedures  . CBC with Differential/Platelet  . CMP14+EGFR  . Lipid Profile  . Vitamin D (25 hydroxy)  . B12 and Folate Panel    Meds ordered this encounter  Medications  . ALPRAZolam (XANAX) 0.5 MG tablet    Sig: Take 1 tablet (0.5 mg total) by mouth 3 (three) times daily as needed for anxiety.    Dispense:  90 tablet    Refill:  2    For future refills    Return for previously scheduled, CPE, Boleslaw Borghi PCP in february. .   Total time spent:*** Minutes Time spent includes review of chart, medications, test results, and follow up plan  with the patient.   [place] Controlled Substance Database was reviewed by me.  This patient was seen by Sallyanne Kuster, FNP-C in collaboration with Dr. Beverely Risen as a part of collaborative care agreement.   Xavious Sharrar R. Tedd Sias, MSN, FNP-C Internal medicine

## 2023-05-17 ENCOUNTER — Encounter: Payer: Self-pay | Admitting: Nurse Practitioner

## 2023-05-19 ENCOUNTER — Ambulatory Visit: Payer: Medicaid Other | Admitting: Obstetrics

## 2023-05-19 NOTE — Progress Notes (Deleted)
GYNECOLOGY: ANNUAL EXAM   Subjective:    PCP: Sallyanne Kuster, NP Alexis Williams is a 32 y.o. female G1P0010 who presents for annual wellness visit.   Well Woman Visit:  GYN HISTORY:  Patient's last menstrual period was 04/08/2023.     Menstrual History: OB History     Gravida  1   Para      Term      Preterm      AB  1   Living         SAB      IAB  1   Ectopic      Multiple      Live Births              Menarche age: *** Patient's last menstrual period was 04/08/2023.     Periods are every *** days, and last *** days, flow is light / moderate / heavy.  Uses pads / tampons / menstrual cup and changes it every *** hours.  Cramping is mild / moderate / severe.  Cyclic symptoms include: {symptoms; gyn cyclic:13153}.  Intermenstrual bleeding, spotting, or discharge? *** Urinary incontinence? ***  Sexually active: *** Number of sexual partners: *** Gender of sexual Partners: *** Social History   Substance and Sexual Activity  Sexual Activity Not Currently   Birth control/protection: None   Contraceptive methods: {PLAN CONTRACEPTION:313102} Dyspareunia? *** STI history: *** STI/HIV testing or immunizations needed? {yes no:314532}   Health Maintenance: -Last pap: {findings; last pap:13140}  --> Any abnormals: *** -Last mammogram: {mammo results:48038} --> Any abnormals? *** -Last colon cancer screen: *** / Type: *** -Last DEXA scan: *** [facility] Breast / Colon / Cervical cancer: *** -Vaccines:  There is no immunization history on file for this patient. Last Tdap: *** / Flu: *** / COVID: *** / Gardasil: *** / Shingles (50+): *** / PCV20:  -Hep C screen: *** -Last lipid / glucose screening: ***  > Exercise: {misc; exercise types:16438}, {exercise level:31265} > Dietary Supplements: Folate: {yes/no:20286};  Calcium: {yes/no:20286}}; Vitamin D: {yes/no:20286} > There is no height or weight on file to calculate BMI.  > Recent dental visit  {yes no:314532} > Seat Belt Use: {yes no:314532} > Texting and driving? {yes no:314532} > Guns in the house {yes no:314532} > Recreational or other drug use: {Drug Use:32241}   Social History   Tobacco Use   Smoking status: Never   Smokeless tobacco: Never  Substance Use Topics   Alcohol use: Never   Occupation: ***    Lives with: ***    PHQ-2 Score: In last two weeks, how often have you felt: Little interest or pleasure in doing things: {PHQGADfrequency:29690::"Not at all (0)"} Feeling down, depressed or hopeless: {PHQGADfrequency:29690::"Not at all (0)"} Score:   GAD-2 Over the last 2 weeks, how often have you been bothered by the following problems? Feeling nervous, anxious or on edge: {PHQGADfrequency:29690::"Not at all (0)"} Not being able to stop or control worrying: {PHQGADfrequency:29690::"Not at all (0)"}} Score: _________________________________________________________  Current Outpatient Medications  Medication Sig Dispense Refill   ALPRAZolam (XANAX) 0.5 MG tablet Take 1 tablet (0.5 mg total) by mouth 3 (three) times daily as needed for anxiety. 90 tablet 2   Ashwagandha (ASHWAGANDHA 35) 120 MG CAPS Take 0.5 mcL by mouth 1 day or 1 dose.     Cholecalciferol (VITAMIN D3 PO) Take by mouth.     No current facility-administered medications for this visit.   Allergies  Allergen Reactions   Codeine Nausea And  Vomiting   Morphine Nausea Only, Nausea And Vomiting and Other (See Comments)   Penicillins Nausea And Vomiting   Benadryl [Diphenhydramine] Other (See Comments)    Irritated, skin crawls    Past Medical History:  Diagnosis Date   Achondroplasia    Anxiety    Arthritis    Depression    Polyarthralgia    PONV (postoperative nausea and vomiting)    PTSD (post-traumatic stress disorder)    Past Surgical History:  Procedure Laterality Date   LEG SURGERY  2005   MYRINGOTOMY WITH TUBE PLACEMENT Bilateral 01/10/2023   Procedure: MYRINGOTOMY WITH TUBE  PLACEMENT;  Surgeon: Linus Salmons, MD;  Location: [facility];  Service: ENT;  Laterality: Bilateral;   OSTECTOMY  2002   spinal infusion  2010,2012    Review Of Systems  Constitutional: Denied constitutional symptoms, night sweats, recent illness, fatigue, fever, insomnia and weight loss.  Eyes: Denied eye symptoms, eye pain, photophobia, vision change and visual disturbance.  Ears/Nose/Throat/Neck: Denied ear, nose, throat or neck symptoms, hearing loss, nasal discharge, sinus congestion and sore throat.  Cardiovascular: Denied cardiovascular symptoms, arrhythmia, chest pain/pressure, edema, exercise intolerance, orthopnea and palpitations.  Respiratory: Denied pulmonary symptoms, asthma, pleuritic pain, productive sputum, cough, dyspnea and wheezing.  Gastrointestinal: Denied, gastro-esophageal reflux, melena, nausea and vomiting.  Genitourinary:*** Denied genitourinary symptoms including symptomatic vaginal discharge, pelvic relaxation issues, and urinary complaints.  Musculoskeletal: Denied musculoskeletal symptoms, stiffness, swelling, muscle weakness and myalgia.  Dermatologic: Denied dermatology symptoms, rash and scar.  Neurologic: Denied neurology symptoms, dizziness, headache, neck pain and syncope.  Psychiatric: Denied psychiatric symptoms, anxiety and depression.  Endocrine: Denied endocrine symptoms including hot flashes and night sweats.      Objective:    LMP 04/08/2023   Constitutional: Well-developed, well-nourished female in no acute distress Neurological: Alert and oriented to person, place, and time Psychiatric: Mood and affect appropriate Skin: No rashes or lesions Neck: Supple without masses. Trachea is midline.Thyroid is normal size without masses Lymphatics: No cervical, axillary, supraclavicular, or inguinal adenopathy noted Respiratory: Clear to auscultation bilaterally. Good air movement with normal work of breathing. Cardiovascular: Regular  rate and rhythm. Extremities grossly normal, nontender with no edema; pulses regular Gastrointestinal: Soft, nontender, nondistended. No masses or hernias appreciated. No hepatosplenomegaly. No fluid wave. No rebound or guarding. Breast Exam: {Exam; breast:13139::"normal appearance, no masses or tenderness"} Genitourinary:         External Genitalia: Normal female genitalia    Vagina: Normal mucosa, no lesions.    Cervix: No lesions, normal size and consistency; no cervical motion tenderness; non-friable; Pap not***obtained.    Uterus: Normal size and contour; smooth, mobile, NT, {Desc; anteverted/retroverted/midposition:60613}. Adnexae: Non-palpable and non-tender Perineum/Anus: No lesions Rectal: deferred    Assessment/Plan:    Alexis Williams is a 32 y.o. female G58P0010 with normal well-woman gynecologic exam.  -Screenings:  Pap: done with cotesting today  *** w/rflx today Mammogram: ordered***due *** Colon: ordered colonoscopy***Cologuard -OR- due *** Labs: ***A1C, CMP, HepC, Lipid panel, Vit D, TSH GAD***PHQ-2 = ***, discussed coping techniques; follow up with PCP if worsens or develops concern -Contraception: *** -Vaccines: UTD, Tdap today; pt declines Influenza. Gardasil: series not started, declined today.  -Healthy lifestyle modifications discussed: multivitamin, diet, exercise, sunscreen, tobacco and alcohol use. Emphasized importance of regular physical activity.  -Folate***Calcium and Vit D recommendation reviewed.  -All questions answered to patient's satisfaction.  -RTC 1 yr for annual, sooner prn.   No follow-ups on file.    Julieanne Manson, DO [facility]  at [facility]

## 2023-05-27 NOTE — Progress Notes (Deleted)
 GYNECOLOGY: ANNUAL EXAM   Subjective:    PCP: Sallyanne Kuster, NP Alexis Williams is a 33 y.o. female G1P0010 who presents for annual wellness visit.   Well Woman Visit:  GYN HISTORY:  Patient's last menstrual period was 04/08/2023.     Menstrual History: OB History     Gravida  1   Para      Term      Preterm      AB  1   Living         SAB      IAB  1   Ectopic      Multiple      Live Births              Menarche age: *** Patient's last menstrual period was 04/08/2023.     Periods are every *** days, and last *** days, flow is light / moderate / heavy.  Uses pads / tampons / menstrual cup and changes it every *** hours.  Cramping is mild / moderate / severe.  Cyclic symptoms include: {symptoms; gyn cyclic:13153}.  Intermenstrual bleeding, spotting, or discharge? *** Urinary incontinence? ***  Sexually active: *** Number of sexual partners: *** Gender of sexual Partners: *** Social History   Substance and Sexual Activity  Sexual Activity Not Currently   Birth control/protection: None   Contraceptive methods: {PLAN CONTRACEPTION:313102} Dyspareunia? *** STI history: *** STI/HIV testing or immunizations needed? {yes no:314532}   Health Maintenance: -Last pap: {findings; last pap:13140}  --> Any abnormals: *** -Last mammogram: {mammo results:48038} --> Any abnormals? *** -Last colon cancer screen: *** / Type: *** -Last DEXA scan: *** [facility] Breast / Colon / Cervical cancer: *** -Vaccines:  There is no immunization history on file for this patient. Last Tdap: *** / Flu: *** / COVID: *** / Gardasil: *** / Shingles (50+): *** / PCV20:  -Hep C screen: *** -Last lipid / glucose screening: ***  > Exercise: {misc; exercise types:16438}, {exercise level:31265} > Dietary Supplements: Folate: {yes/no:20286};  Calcium: {yes/no:20286}}; Vitamin D: {yes/no:20286} > There is no height or weight on file to calculate BMI.  > Recent dental visit  {yes no:314532} > Seat Belt Use: {yes no:314532} > Texting and driving? {yes no:314532} > Guns in the house {yes no:314532} > Recreational or other drug use: {Drug Use:32241}   Social History   Tobacco Use   Smoking status: Never   Smokeless tobacco: Never  Substance Use Topics   Alcohol use: Never   Occupation: ***    Lives with: ***    PHQ-2 Score: In last two weeks, how often have you felt: Little interest or pleasure in doing things: {PHQGADfrequency:29690::"Not at all (0)"} Feeling down, depressed or hopeless: {PHQGADfrequency:29690::"Not at all (0)"} Score:   GAD-2 Over the last 2 weeks, how often have you been bothered by the following problems? Feeling nervous, anxious or on edge: {PHQGADfrequency:29690::"Not at all (0)"} Not being able to stop or control worrying: {PHQGADfrequency:29690::"Not at all (0)"}} Score: _________________________________________________________  Current Outpatient Medications  Medication Sig Dispense Refill   ALPRAZolam (XANAX) 0.5 MG tablet Take 1 tablet (0.5 mg total) by mouth 3 (three) times daily as needed for anxiety. 90 tablet 2   Ashwagandha (ASHWAGANDHA 35) 120 MG CAPS Take 0.5 mcL by mouth 1 day or 1 dose.     Cholecalciferol (VITAMIN D3 PO) Take by mouth.     No current facility-administered medications for this visit.   Allergies  Allergen Reactions   Codeine Nausea And  Vomiting   Morphine Nausea Only, Nausea And Vomiting and Other (See Comments)   Penicillins Nausea And Vomiting   Benadryl [Diphenhydramine] Other (See Comments)    Irritated, skin crawls    Past Medical History:  Diagnosis Date   Achondroplasia    Anxiety    Arthritis    Depression    Polyarthralgia    PONV (postoperative nausea and vomiting)    PTSD (post-traumatic stress disorder)    Past Surgical History:  Procedure Laterality Date   LEG SURGERY  2005   MYRINGOTOMY WITH TUBE PLACEMENT Bilateral 01/10/2023   Procedure: MYRINGOTOMY WITH TUBE  PLACEMENT;  Surgeon: Linus Salmons, MD;  Location: [facility];  Service: ENT;  Laterality: Bilateral;   OSTECTOMY  2002   spinal infusion  2010,2012    Review Of Systems  Constitutional: Denied constitutional symptoms, night sweats, recent illness, fatigue, fever, insomnia and weight loss.  Eyes: Denied eye symptoms, eye pain, photophobia, vision change and visual disturbance.  Ears/Nose/Throat/Neck: Denied ear, nose, throat or neck symptoms, hearing loss, nasal discharge, sinus congestion and sore throat.  Cardiovascular: Denied cardiovascular symptoms, arrhythmia, chest pain/pressure, edema, exercise intolerance, orthopnea and palpitations.  Respiratory: Denied pulmonary symptoms, asthma, pleuritic pain, productive sputum, cough, dyspnea and wheezing.  Gastrointestinal: Denied, gastro-esophageal reflux, melena, nausea and vomiting.  Genitourinary:*** Denied genitourinary symptoms including symptomatic vaginal discharge, pelvic relaxation issues, and urinary complaints.  Musculoskeletal: Denied musculoskeletal symptoms, stiffness, swelling, muscle weakness and myalgia.  Dermatologic: Denied dermatology symptoms, rash and scar.  Neurologic: Denied neurology symptoms, dizziness, headache, neck pain and syncope.  Psychiatric: Denied psychiatric symptoms, anxiety and depression.  Endocrine: Denied endocrine symptoms including hot flashes and night sweats.      Objective:    LMP 04/08/2023   Constitutional: Well-developed, well-nourished female in no acute distress Neurological: Alert and oriented to person, place, and time Psychiatric: Mood and affect appropriate Skin: No rashes or lesions Neck: Supple without masses. Trachea is midline.Thyroid is normal size without masses Lymphatics: No cervical, axillary, supraclavicular, or inguinal adenopathy noted Respiratory: Clear to auscultation bilaterally. Good air movement with normal work of breathing. Cardiovascular: Regular  rate and rhythm. Extremities grossly normal, nontender with no edema; pulses regular Gastrointestinal: Soft, nontender, nondistended. No masses or hernias appreciated. No hepatosplenomegaly. No fluid wave. No rebound or guarding. Breast Exam: {Exam; breast:13139::"normal appearance, no masses or tenderness"} Genitourinary:         External Genitalia: Normal female genitalia    Vagina: Normal mucosa, no lesions.    Cervix: No lesions, normal size and consistency; no cervical motion tenderness; non-friable; Pap not***obtained.    Uterus: Normal size and contour; smooth, mobile, NT, {Desc; anteverted/retroverted/midposition:60613}. Adnexae: Non-palpable and non-tender Perineum/Anus: No lesions Rectal: deferred    Assessment/Plan:    Alexis Williams is a 33 y.o. female G58P0010 with normal well-woman gynecologic exam.  -Screenings:  Pap: done with cotesting today  *** w/rflx today Mammogram: ordered***due *** Colon: ordered colonoscopy***Cologuard -OR- due *** Labs: ***A1C, CMP, HepC, Lipid panel, Vit D, TSH GAD***PHQ-2 = ***, discussed coping techniques; follow up with PCP if worsens or develops concern -Contraception: *** -Vaccines: UTD, Tdap today; pt declines Influenza. Gardasil: series not started, declined today.  -Healthy lifestyle modifications discussed: multivitamin, diet, exercise, sunscreen, tobacco and alcohol use. Emphasized importance of regular physical activity.  -Folate***Calcium and Vit D recommendation reviewed.  -All questions answered to patient's satisfaction.  -RTC 1 yr for annual, sooner prn.   No follow-ups on file.    Julieanne Manson, DO [facility]  at [facility]

## 2023-05-28 ENCOUNTER — Ambulatory Visit: Payer: Medicaid Other | Admitting: Obstetrics

## 2023-05-28 DIAGNOSIS — Z01419 Encounter for gynecological examination (general) (routine) without abnormal findings: Secondary | ICD-10-CM

## 2023-05-28 DIAGNOSIS — Z1159 Encounter for screening for other viral diseases: Secondary | ICD-10-CM

## 2023-05-28 DIAGNOSIS — Z113 Encounter for screening for infections with a predominantly sexual mode of transmission: Secondary | ICD-10-CM

## 2023-05-28 DIAGNOSIS — Z124 Encounter for screening for malignant neoplasm of cervix: Secondary | ICD-10-CM

## 2023-06-04 ENCOUNTER — Encounter: Payer: Self-pay | Admitting: Obstetrics

## 2023-06-26 ENCOUNTER — Telehealth: Payer: Self-pay | Admitting: Nurse Practitioner

## 2023-06-26 NOTE — Telephone Encounter (Signed)
 Patient's mother called stating patient is vomiting and blood in stool. Due to no available appointments, I recommending patient going to urgent care or ED-Toni

## 2023-07-18 ENCOUNTER — Encounter: Payer: Medicaid Other | Admitting: Nurse Practitioner

## 2023-08-01 ENCOUNTER — Encounter: Payer: Medicaid Other | Admitting: Nurse Practitioner

## 2023-08-05 ENCOUNTER — Encounter: Admitting: Nurse Practitioner

## 2023-08-13 ENCOUNTER — Ambulatory Visit (INDEPENDENT_AMBULATORY_CARE_PROVIDER_SITE_OTHER): Admitting: Nurse Practitioner

## 2023-08-13 ENCOUNTER — Telehealth: Payer: Self-pay | Admitting: Nurse Practitioner

## 2023-08-13 ENCOUNTER — Encounter: Payer: Self-pay | Admitting: Nurse Practitioner

## 2023-08-13 VITALS — BP 114/82 | HR 81 | Temp 98.3°F | Resp 16 | Ht <= 58 in | Wt 94.4 lb

## 2023-08-13 DIAGNOSIS — F410 Panic disorder [episodic paroxysmal anxiety] without agoraphobia: Secondary | ICD-10-CM | POA: Diagnosis not present

## 2023-08-13 DIAGNOSIS — L409 Psoriasis, unspecified: Secondary | ICD-10-CM | POA: Diagnosis not present

## 2023-08-13 DIAGNOSIS — G479 Sleep disorder, unspecified: Secondary | ICD-10-CM

## 2023-08-13 MED ORDER — ALPRAZOLAM 0.5 MG PO TABS: 0.5000 mg | ORAL_TABLET | Freq: Three times a day (TID) | ORAL | 2 refills | Status: DC | PRN

## 2023-08-13 NOTE — Telephone Encounter (Signed)
 Dermatology referral faxed to [facility]; [phone]. Attempted to notify patient. No answer, no vm set up-Toni

## 2023-08-13 NOTE — Progress Notes (Signed)
 [facility] [address] [place], [place] [address]  Internal MEDICINE  Office Visit Note  Patient Name: Alexis Williams  [id number]  [id number]  Date of Service: 08/13/2023  Chief Complaint  Patient presents with   Depression   Follow-up    HPI Jennise presents for a follow-up visit for rash on scalp, anxiety and refills.  Anxiety -- controlled with prn alprazolam, due for refills  Sleep disturbance -- alprazolam helps with sleep at night Moving out of parent's house in April, has a vehicle now, and a job, has increased stress at home with parents  Rash of scalp -- was told it was psoriasis in the past but was never diagnosed. Has had this problem for a long time and has scarring on scalp from it.     Current Medication: Outpatient Encounter Medications as of 08/13/2023  Medication Sig   Ashwagandha (ASHWAGANDHA 35) 120 MG CAPS Take 0.5 mcL by mouth 1 day or 1 dose.   Cholecalciferol (VITAMIN D3 PO) Take by mouth.   [DISCONTINUED] ALPRAZolam (XANAX) 0.5 MG tablet Take 1 tablet (0.5 mg total) by mouth 3 (three) times daily as needed for anxiety.   ALPRAZolam (XANAX) 0.5 MG tablet Take 1 tablet (0.5 mg total) by mouth 3 (three) times daily as needed for anxiety.   No facility-administered encounter medications on file as of 08/13/2023.    Surgical History: Past Surgical History:  Procedure Laterality Date   LEG SURGERY  2005   MYRINGOTOMY WITH TUBE PLACEMENT Bilateral 01/10/2023   Procedure: MYRINGOTOMY WITH TUBE PLACEMENT;  Surgeon: Linus Salmons, MD;  Location: [facility];  Service: ENT;  Laterality: Bilateral;   OSTECTOMY  2002   spinal infusion  2010,2012    Medical History: Past Medical History:  Diagnosis Date   Achondroplasia    Anxiety    Arthritis    Depression    Polyarthralgia    PONV (postoperative nausea and vomiting)    PTSD (post-traumatic stress disorder)     Family History: Family History  Problem Relation Age of Onset    Thyroid cancer Mother 61   Breast cancer Maternal Grandmother        30/40s   Lung cancer Maternal Great-grandmother    COPD Maternal Great-grandmother     Social History   Socioeconomic History   Marital status: Single    Spouse name: Not on file   Number of children: Not on file   Years of education: Not on file   Highest education level: Not on file  Occupational History   Not on file  Tobacco Use   Smoking status: Never   Smokeless tobacco: Never  Vaping Use   Vaping status: Never Used  Substance and Sexual Activity   Alcohol use: Never   Drug use: Never   Sexual activity: Not Currently    Birth control/protection: None  Other Topics Concern   Not on file  Social History Narrative   Not on file   Social Drivers of Health   Financial Resource Strain: Not on file  Food Insecurity: Not on file  Transportation Needs: Not on file  Physical Activity: Not on file  Stress: Not on file  Social Connections: Not on file  Intimate Partner Violence: Not on file      Review of Systems  Constitutional:  Positive for fatigue. Negative for chills and unexpected weight change.  HENT:  Negative for congestion, postnasal drip, rhinorrhea, sneezing and sore throat.   Eyes:  Negative for redness.  Respiratory:  Negative for cough, chest tightness, shortness of breath and wheezing.   Cardiovascular: Negative.  Negative for chest pain and palpitations.  Gastrointestinal: Negative.  Negative for abdominal pain, constipation, diarrhea, nausea and vomiting.  Musculoskeletal:  Positive for arthralgias and back pain. Negative for joint swelling and neck pain.  Skin:  Negative for rash.  Neurological: Negative.  Negative for tremors and numbness.  Hematological:  Negative for adenopathy. Does not bruise/bleed easily.  Psychiatric/Behavioral:  Positive for behavioral problems (Depression) and sleep disturbance. Negative for self-injury and suicidal ideas. The patient is nervous/anxious.      Vital Signs: BP 114/82   Pulse 81   Temp 98.3 F (36.8 C)   Resp 16   Ht 4' 2.5" (1.283 m)   Wt 94 lb 6.4 oz (42.8 kg)   SpO2 96%   BMI 26.03 kg/m    Physical Exam Vitals reviewed.  Constitutional:      General: She is not in acute distress.    Appearance: Normal appearance. She is normal weight. She is not ill-appearing.  HENT:     Head: Normocephalic and atraumatic.     Mouth/Throat:     Mouth: Mucous membranes are moist.  Eyes:     Pupils: Pupils are equal, round, and reactive to light.  Cardiovascular:     Rate and Rhythm: Normal rate and regular rhythm.  Pulmonary:     Effort: Pulmonary effort is normal. No respiratory distress.  Neurological:     Mental Status: She is alert and oriented to person, place, and time.  Psychiatric:        Mood and Affect: Mood and affect normal.        Behavior: Behavior is cooperative.        Assessment/Plan: 1. Scalp psoriasis (Primary) Referred to dermatology for further evaluation and treatment - Ambulatory referral to Dermatology  2. Sleep disturbance Continue prn alprazolam as prescribed.   3. Severe anxiety with panic Continue prn alprazolam as prescribed, follow up in 3 months for additional refills.  - ALPRAZolam (XANAX) 0.5 MG tablet; Take 1 tablet (0.5 mg total) by mouth 3 (three) times daily as needed for anxiety.  Dispense: 90 tablet; Refill: 2   General Counseling: Jalaiyah verbalizes understanding of the findings of todays visit and agrees with plan of treatment. I have discussed any further diagnostic evaluation that may be needed or ordered today. We also reviewed her medications today. she has been encouraged to call the office with any questions or concerns that should arise related to todays visit.    Orders Placed This Encounter  Procedures   Ambulatory referral to Dermatology    Meds ordered this encounter  Medications   ALPRAZolam (XANAX) 0.5 MG tablet    Sig: Take 1 tablet (0.5 mg total) by  mouth 3 (three) times daily as needed for anxiety.    Dispense:  90 tablet    Refill:  2    For future refills    Return in about 12 weeks (around 11/05/2023) for CPE, Karanveer Ramakrishnan PCP and , anxiety med refill.   Total time spent:30 Minutes Time spent includes review of chart, medications, test results, and follow up plan with the patient.   [place] Controlled Substance Database was reviewed by me.  This patient was seen by Sallyanne Kuster, FNP-C in collaboration with Dr. Beverely Risen as a part of collaborative care agreement.   Floree Zuniga R. Tedd Sias, MSN, FNP-C Internal medicine

## 2023-09-11 DIAGNOSIS — J029 Acute pharyngitis, unspecified: Secondary | ICD-10-CM | POA: Diagnosis not present

## 2023-11-14 ENCOUNTER — Encounter: Admitting: Nurse Practitioner

## 2023-11-19 ENCOUNTER — Telehealth: Payer: Self-pay | Admitting: Nurse Practitioner

## 2023-11-19 NOTE — Telephone Encounter (Signed)
 No vm, sent mychart message to confirm 11/26/23 appointment-Toni

## 2023-11-26 ENCOUNTER — Encounter: Admitting: Nurse Practitioner

## 2024-01-12 ENCOUNTER — Encounter: Payer: Self-pay | Admitting: Nurse Practitioner

## 2024-01-12 ENCOUNTER — Ambulatory Visit: Admitting: Nurse Practitioner

## 2024-01-12 VITALS — BP 117/72 | HR 77 | Temp 98.4°F | Resp 16 | Ht <= 58 in | Wt 96.2 lb

## 2024-01-12 DIAGNOSIS — E782 Mixed hyperlipidemia: Secondary | ICD-10-CM

## 2024-01-12 DIAGNOSIS — E559 Vitamin D deficiency, unspecified: Secondary | ICD-10-CM | POA: Diagnosis not present

## 2024-01-12 DIAGNOSIS — F410 Panic disorder [episodic paroxysmal anxiety] without agoraphobia: Secondary | ICD-10-CM

## 2024-01-12 DIAGNOSIS — R5383 Other fatigue: Secondary | ICD-10-CM

## 2024-01-12 DIAGNOSIS — E349 Endocrine disorder, unspecified: Secondary | ICD-10-CM

## 2024-01-12 DIAGNOSIS — E538 Deficiency of other specified B group vitamins: Secondary | ICD-10-CM | POA: Diagnosis not present

## 2024-01-12 MED ORDER — ALPRAZOLAM 0.5 MG PO TABS: 0.5000 mg | ORAL_TABLET | Freq: Three times a day (TID) | ORAL | 2 refills | Status: DC | PRN

## 2024-01-12 NOTE — Progress Notes (Signed)
 [facility] [address] [place], [place] [address]  Internal MEDICINE  Office Visit Note  Patient Name: Alexis Williams  [id number]  [id number]  Date of Service: 01/12/2024  Chief Complaint  Patient presents with   Depression   Follow-up    HPI Alexis Williams presents for a follow-up visit for anxiety, ptsd, hormonal imbalance, and routine lab orders. Increased anxiety -- working with students, having more anxiety and panic attacks.  Concern for hormone imbalance, levels were ok in may, wants to repeat them. Also concern for increased stress and thyroid  issues. Due for routine labs     Current Medication: Outpatient Encounter Medications as of 01/12/2024  Medication Sig   ALPRAZolam  (XANAX ) 0.5 MG tablet Take 1 tablet (0.5 mg total) by mouth 3 (three) times daily as needed for anxiety.   Ashwagandha (ASHWAGANDHA 35) 120 MG CAPS Take 0.5 mcL by mouth 1 day or 1 dose.   Cholecalciferol (VITAMIN D3 PO) Take by mouth.   [DISCONTINUED] ALPRAZolam  (XANAX ) 0.5 MG tablet Take 1 tablet (0.5 mg total) by mouth 3 (three) times daily as needed for anxiety.   No facility-administered encounter medications on file as of 01/12/2024.    Surgical History: Past Surgical History:  Procedure Laterality Date   LEG SURGERY  2005   MYRINGOTOMY WITH TUBE PLACEMENT Bilateral 01/10/2023   Procedure: MYRINGOTOMY WITH TUBE PLACEMENT;  Surgeon: Herminio Miu, MD;  Location: [facility];  Service: ENT;  Laterality: Bilateral;   OSTECTOMY  2002   spinal infusion  2010,2012    Medical History: Past Medical History:  Diagnosis Date   Achondroplasia    Anxiety    Arthritis    Depression    Polyarthralgia    PONV (postoperative nausea and vomiting)    PTSD (post-traumatic stress disorder)     Family History: Family History  Problem Relation Age of Onset   Thyroid  cancer Mother 88   Breast cancer Maternal Grandmother        30/40s   Lung cancer Maternal Great-grandmother     COPD Maternal Great-grandmother     Social History   Socioeconomic History   Marital status: Single    Spouse name: Not on file   Number of children: Not on file   Years of education: Not on file   Highest education level: Not on file  Occupational History   Not on file  Tobacco Use   Smoking status: Never   Smokeless tobacco: Never  Vaping Use   Vaping status: Never Used  Substance and Sexual Activity   Alcohol use: Never   Drug use: Never   Sexual activity: Not Currently    Birth control/protection: None  Other Topics Concern   Not on file  Social History Narrative   Not on file   Social Drivers of Health   Financial Resource Strain: Medium Risk (10/06/2023)   Received from [facility]   Overall Financial Resource Strain (CARDIA)    Difficulty of Paying Living Expenses: Somewhat hard  Food Insecurity: Food Insecurity Present (10/06/2023)   Received from [facility]   Hunger Vital Sign    Within the past 12 months, you worried that your food would run out before you got the money to buy more.: Never true    Within the past 12 months, the food you bought just didn't last and you didn't have money to get more.: Sometimes true  Transportation Needs: No Transportation Needs (10/06/2023)   Received from [facility]   PRAPARE - Transportation    In the past 12 months, has lack of transportation kept you from medical appointments or from getting medications?: No    Lack of Transportation (Non-Medical): No  Physical Activity: Not on file  Stress: Not on file  Social Connections: Not on file  Intimate Partner Violence: Not on file      Review of Systems  Constitutional:  Positive for fatigue. Negative for chills and unexpected weight change.  HENT:  Negative for congestion, postnasal drip, rhinorrhea, sneezing and sore throat.   Eyes:  Negative for redness.  Respiratory:  Negative for cough, chest tightness, shortness  of breath and wheezing.   Cardiovascular: Negative.  Negative for chest pain and palpitations.  Gastrointestinal: Negative.  Negative for abdominal pain, constipation, diarrhea, nausea and vomiting.  Musculoskeletal:  Positive for arthralgias and back pain. Negative for joint swelling and neck pain.  Skin:  Negative for rash.  Neurological: Negative.  Negative for tremors and numbness.  Hematological:  Negative for adenopathy. Does not bruise/bleed easily.  Psychiatric/Behavioral:  Positive for behavioral problems (Depression) and sleep disturbance. Negative for self-injury and suicidal ideas. The patient is nervous/anxious.     Vital Signs: BP 117/72   Pulse 77   Temp 98.4 F (36.9 C)   Resp 16   Ht 4' 2.5 (1.283 m)   Wt 96 lb 3.2 oz (43.6 kg)   SpO2 99%   BMI 26.52 kg/m    Physical Exam Vitals reviewed.  Constitutional:      General: She is not in acute distress.    Appearance: Normal appearance. She is normal weight. She is not ill-appearing.  HENT:     Head: Normocephalic and atraumatic.     Mouth/Throat:     Mouth: Mucous membranes are moist.  Eyes:     Pupils: Pupils are equal, round, and reactive to light.  Cardiovascular:     Rate and Rhythm: Normal rate and regular rhythm.  Pulmonary:     Effort: Pulmonary effort is normal. No respiratory distress.  Neurological:     Mental Status: She is alert and oriented to person, place, and time.  Psychiatric:        Mood and Affect: Mood is anxious and depressed. Affect is tearful.        Behavior: Behavior normal. Behavior is cooperative.        Assessment/Plan: 1. Other fatigue (Primary) Routine and additional labs ordered  - Cortisol-am, blood - TSH+T4F+T3Free+ThyAbs+TPO+VD25 - CBC with Differential/Platelet - CMP14+EGFR - Lipid Profile - Iron, TIBC and Ferritin Panel - B12 and Folate Panel  2. Hormonal disorder Routine labs and additional labs ordered  - Cortisol-am, blood -  TSH+T4F+T3Free+ThyAbs+TPO+VD25 - CBC with Differential/Platelet - CMP14+EGFR - Lipid Profile - Iron, TIBC and Ferritin Panel - B12 and Folate Panel  3. Mixed hyperlipidemia Routine and additional labs ordered  - Cortisol-am, blood - TSH+T4F+T3Free+ThyAbs+TPO+VD25 - CBC with Differential/Platelet - CMP14+EGFR - Lipid Profile - Iron, TIBC and Ferritin Panel - B12 and Folate Panel  4. B12 deficiency Routine and additional labs ordered  - Cortisol-am, blood - TSH+T4F+T3Free+ThyAbs+TPO+VD25 - CBC with Differential/Platelet - CMP14+EGFR - Lipid Profile - Iron, TIBC and Ferritin Panel - B12 and Folate Panel  5. Vitamin D  deficiency Routine and additional labs ordered  - Cortisol-am, blood - TSH+T4F+T3Free+ThyAbs+TPO+VD25 - CBC with Differential/Platelet - CMP14+EGFR - Lipid Profile - Iron, TIBC and Ferritin Panel - B12 and Folate Panel  6. Severe anxiety with panic Continue prn alprazolam  as prescribed, routine  and additional labs ordered  - Cortisol-am, blood - TSH+T4F+T3Free+ThyAbs+TPO+VD25 - CBC with Differential/Platelet - CMP14+EGFR - Lipid Profile - Iron, TIBC and Ferritin Panel - B12 and Folate Panel - ALPRAZolam  (XANAX ) 0.5 MG tablet; Take 1 tablet (0.5 mg total) by mouth 3 (three) times daily as needed for anxiety.  Dispense: 90 tablet; Refill: 2   General Counseling: Blessed verbalizes understanding of the findings of todays visit and agrees with plan of treatment. I have discussed any further diagnostic evaluation that may be needed or ordered today. We also reviewed her medications today. she has been encouraged to call the office with any questions or concerns that should arise related to todays visit.    Orders Placed This Encounter  Procedures   Cortisol-am, blood   TSH+T4F+T3Free+ThyAbs+TPO+VD25   CBC with Differential/Platelet   CMP14+EGFR   Lipid Profile   Iron, TIBC and Ferritin Panel   B12 and Folate Panel    Meds ordered this encounter   Medications   ALPRAZolam  (XANAX ) 0.5 MG tablet    Sig: Take 1 tablet (0.5 mg total) by mouth 3 (three) times daily as needed for anxiety.    Dispense:  90 tablet    Refill:  2    For future refills    Return in about 1 month (around 02/12/2024) for F/U, Labs, Aloni Chuang PCP.   Total time spent:30 Minutes Time spent includes review of chart, medications, test results, and follow up plan with the patient.   [place] Controlled Substance Database was reviewed by me.  This patient was seen by Mardy Maxin, FNP-C in collaboration with Dr. Sigrid Bathe as a part of collaborative care agreement.   Sohum Delillo R. Maxin, MSN, FNP-C Internal medicine

## 2024-01-14 ENCOUNTER — Encounter: Payer: Self-pay | Admitting: Nurse Practitioner

## 2024-01-30 ENCOUNTER — Telehealth: Payer: Self-pay | Admitting: Nurse Practitioner

## 2024-01-30 NOTE — Telephone Encounter (Signed)
 No answer, no vm, sent mychart message to confirm 02/06/24 appointment-Toni

## 2024-02-06 ENCOUNTER — Ambulatory Visit: Admitting: Nurse Practitioner

## 2024-02-06 NOTE — Progress Notes (Deleted)
 [facility] [address] [place], [place] [address]  Internal MEDICINE  Office Visit Note  Patient Name: Alexis Williams  [id number]  [id number]  Date of Service: 02/06/2024  No chief complaint on file.   HPI Alexis Williams presents for an annual well visit and physical exam.  Well-appearing 33 y.o. 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  Pap smear: sees OBGYN Labs: lab orders given to patient in August, remind patient to get labs drawn as soon as possible. She had several labs drawn in may this year but additional labs were ordered for further evaluation.  New or worsening pain: chronic pain in multiple joints.  Other concerns: Seeing therapist regularly. Has been experiencing more anxiety lately due to starting a new job.     Current Medication: Outpatient Encounter Medications as of 02/06/2024  Medication Sig   ALPRAZolam  (XANAX ) 0.5 MG tablet Take 1 tablet (0.5 mg total) by mouth 3 (three) times daily as needed for anxiety.   Ashwagandha (ASHWAGANDHA 35) 120 MG CAPS Take 0.5 mcL by mouth 1 day or 1 dose.   Cholecalciferol (VITAMIN D3 PO) Take by mouth.   No facility-administered encounter medications on file as of 02/06/2024.    Surgical History: Past Surgical History:  Procedure Laterality Date   LEG SURGERY  2005   MYRINGOTOMY WITH TUBE PLACEMENT Bilateral 01/10/2023   Procedure: MYRINGOTOMY WITH TUBE PLACEMENT;  Surgeon: Herminio Miu, MD;  Location: [facility];  Service: ENT;  Laterality: Bilateral;   OSTECTOMY  2002   spinal infusion  2010,2012    Medical History: Past Medical History:  Diagnosis Date   Achondroplasia    Anxiety    Arthritis    Depression    Polyarthralgia    PONV (postoperative nausea and vomiting)    PTSD (post-traumatic stress disorder)     Family History: Family History  Problem Relation Age of Onset   Thyroid  cancer Mother 28   Breast cancer Maternal Grandmother        30/40s    Lung cancer Maternal Great-grandmother    COPD Maternal Great-grandmother     Social History   Socioeconomic History   Marital status: Single    Spouse name: Not on file   Number of children: Not on file   Years of education: Not on file   Highest education level: Not on file  Occupational History   Not on file  Tobacco Use   Smoking status: Never   Smokeless tobacco: Never  Vaping Use   Vaping status: Never Used  Substance and Sexual Activity   Alcohol use: Never   Drug use: Never   Sexual activity: Not Currently    Birth control/protection: None  Other Topics Concern   Not on file  Social History Narrative   Not on file   Social Drivers of Health   Financial Resource Strain: Medium Risk (10/06/2023)   Received from [facility]   Overall Financial Resource Strain (CARDIA)    Difficulty of Paying Living Expenses: Somewhat hard  Food Insecurity: Food Insecurity Present (10/06/2023)   Received from [facility]   Hunger Vital Sign    Within the past 12 months, you worried that your food would run out before you got the money to buy more.: Never true    Within the past 12 months, the food you bought just didn't last and you didn't have money to get more.: Sometimes true  Transportation Needs: No Transportation Needs (10/06/2023)  Received from [facility]    In the past 12 months, has lack of transportation kept you from medical appointments or from getting medications?: No    Lack of Transportation (Non-Medical): No  Physical Activity: Not on file  Stress: Not on file  Social Connections: Not on file  Intimate Partner Violence: Not on file      Review of Systems  Constitutional:  Positive for fatigue. Negative for activity change, appetite change, chills, fever and unexpected weight change.  HENT: Negative.  Negative for congestion, ear pain, postnasal drip, rhinorrhea, sneezing, sore  throat and trouble swallowing.   Eyes: Negative.  Negative for redness.  Respiratory: Negative.  Negative for cough, chest tightness, shortness of breath and wheezing.   Cardiovascular: Negative.  Negative for chest pain and palpitations.  Gastrointestinal: Negative.  Negative for abdominal pain, blood in stool, constipation, diarrhea, nausea and vomiting.  Endocrine: Negative.   Genitourinary: Negative.  Negative for difficulty urinating, dysuria, frequency, hematuria and urgency.  Musculoskeletal:  Positive for arthralgias and back pain. Negative for joint swelling, myalgias and neck pain.  Skin: Negative.  Negative for rash and wound.  Allergic/Immunologic: Negative.  Negative for immunocompromised state.  Neurological: Negative.  Negative for dizziness, tremors, seizures, numbness and headaches.  Hematological: Negative.  Negative for adenopathy. Does not bruise/bleed easily.  Psychiatric/Behavioral:  Positive for behavioral problems (Depression) and sleep disturbance. Negative for self-injury and suicidal ideas. The patient is nervous/anxious.     Vital Signs: There were no vitals taken for this visit.   Physical Exam Vitals reviewed.  Constitutional:      General: She is not in acute distress.    Appearance: Normal appearance. She is well-developed. She is not ill-appearing or diaphoretic.  HENT:     Head: Normocephalic and atraumatic.     Right Ear: Tympanic membrane, ear canal and external ear normal.     Left Ear: Tympanic membrane, ear canal and external ear normal.     Nose: Nose normal. No congestion or rhinorrhea.     Mouth/Throat:     Mouth: Mucous membranes are moist.     Pharynx: Oropharynx is clear. No oropharyngeal exudate or posterior oropharyngeal erythema.  Eyes:     General: No scleral icterus.       Right eye: No discharge.        Left eye: No discharge.     Extraocular Movements: Extraocular movements intact.     Conjunctiva/sclera: Conjunctivae normal.      Pupils: Pupils are equal, round, and reactive to light.  Neck:     Thyroid : No thyromegaly.     Vascular: No JVD.     Trachea: No tracheal deviation.  Cardiovascular:     Rate and Rhythm: Normal rate and regular rhythm.     Pulses: Normal pulses.     Heart sounds: Normal heart sounds. No murmur heard.    No friction rub. No gallop.  Pulmonary:     Effort: Pulmonary effort is normal. No respiratory distress.     Breath sounds: Normal breath sounds. No stridor. No wheezing or rales.  Chest:     Chest wall: No tenderness.  Abdominal:     General: Bowel sounds are normal. There is no distension.     Palpations: Abdomen is soft. There is no mass.     Tenderness: There is no abdominal tenderness. There is no guarding or rebound.  Musculoskeletal:        General: No  tenderness or deformity. Normal range of motion.     Cervical back: Normal range of motion and neck supple.  Lymphadenopathy:     Cervical: No cervical adenopathy.  Skin:    General: Skin is warm and dry.     Capillary Refill: Capillary refill takes less than 2 seconds.     Coloration: Skin is not pale.     Findings: No erythema or rash.  Neurological:     Mental Status: She is alert and oriented to person, place, and time.     Cranial Nerves: No cranial nerve deficit.     Motor: No abnormal muscle tone.     Coordination: Coordination normal.     Gait: Gait normal.     Deep Tendon Reflexes: Reflexes are normal and symmetric.  Psychiatric:        Attention and Perception: She does not perceive auditory or visual hallucinations.        Mood and Affect: Mood is anxious and depressed. Affect is tearful.        Speech: Speech normal.        Behavior: Behavior normal. Behavior is cooperative.        Thought Content: Thought content normal. Thought content is not paranoid or delusional. Thought content does not include homicidal or suicidal ideation.        Judgment: Judgment is impulsive.         Assessment/Plan:     General Counseling: Maalle verbalizes understanding of the findings of todays visit and agrees with plan of treatment. I have discussed any further diagnostic evaluation that may be needed or ordered today. We also reviewed her medications today. she has been encouraged to call the office with any questions or concerns that should arise related to todays visit.    No orders of the defined types were placed in this encounter.   No orders of the defined types were placed in this encounter.   No follow-ups on file.   Total time spent:30 Minutes Time spent includes review of chart, medications, test results, and follow up plan with the patient.   [place] Controlled Substance Database was reviewed by me.  This patient was seen by Mardy Maxin, FNP-C in collaboration with Dr. Sigrid Bathe as a part of collaborative care agreement.  Dacen Frayre R. Maxin, MSN, FNP-C Internal medicine

## 2024-02-21 NOTE — Progress Notes (Signed)
 No show

## 2024-03-08 ENCOUNTER — Encounter: Payer: Self-pay | Admitting: Nurse Practitioner

## 2024-03-08 ENCOUNTER — Ambulatory Visit: Admitting: Nurse Practitioner

## 2024-03-08 VITALS — BP 120/83 | HR 79 | Temp 98.2°F | Resp 16 | Ht <= 58 in | Wt 96.4 lb

## 2024-03-08 DIAGNOSIS — Z0001 Encounter for general adult medical examination with abnormal findings: Secondary | ICD-10-CM

## 2024-03-08 DIAGNOSIS — F410 Panic disorder [episodic paroxysmal anxiety] without agoraphobia: Secondary | ICD-10-CM | POA: Diagnosis not present

## 2024-03-08 DIAGNOSIS — E559 Vitamin D deficiency, unspecified: Secondary | ICD-10-CM

## 2024-03-08 DIAGNOSIS — L700 Acne vulgaris: Secondary | ICD-10-CM | POA: Diagnosis not present

## 2024-03-08 DIAGNOSIS — H6611 Chronic tubotympanic suppurative otitis media, right ear: Secondary | ICD-10-CM | POA: Diagnosis not present

## 2024-03-08 DIAGNOSIS — E782 Mixed hyperlipidemia: Secondary | ICD-10-CM

## 2024-03-08 DIAGNOSIS — R5383 Other fatigue: Secondary | ICD-10-CM

## 2024-03-08 DIAGNOSIS — E538 Deficiency of other specified B group vitamins: Secondary | ICD-10-CM

## 2024-03-08 MED ORDER — ALPRAZOLAM 0.5 MG PO TABS: 0.5000 mg | ORAL_TABLET | Freq: Three times a day (TID) | ORAL | 2 refills | Status: DC | PRN

## 2024-03-08 MED ORDER — TRETINOIN 0.025 % EX CREA: TOPICAL_CREAM | Freq: Every day | CUTANEOUS | 2 refills | Status: AC

## 2024-03-08 NOTE — Progress Notes (Signed)
 [facility] [address] [place], [place] [address]  Internal MEDICINE  Office Visit Note  Patient Name: Alexis Williams  [id number]  [id number]  Date of Service: 03/08/2024  Chief Complaint  Patient presents with   Depression   Annual Exam    HPI Alexis Williams presents for an annual well visit and physical exam.  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  Pap smear: goes to [employer] [facility]: due for labs , has orders.  New or worsening pain: none  Other concerns: none  Has purulent drainage from the right ear.    Current Medication: Outpatient Encounter Medications as of 03/08/2024  Medication Sig   tretinoin (RETIN-A) 0.025 % cream Apply topically at bedtime. To face and wash off in the morning   ALPRAZolam  (XANAX ) 0.5 MG tablet Take 1 tablet (0.5 mg total) by mouth 3 (three) times daily as needed for anxiety.   Ashwagandha (ASHWAGANDHA 35) 120 MG CAPS Take 0.5 mcL by mouth 1 day or 1 dose. (Patient not taking: Reported on 03/08/2024)   Cholecalciferol (VITAMIN D3 PO) Take by mouth.   [DISCONTINUED] ALPRAZolam  (XANAX ) 0.5 MG tablet Take 1 tablet (0.5 mg total) by mouth 3 (three) times daily as needed for anxiety.   No facility-administered encounter medications on file as of 03/08/2024.    Surgical History: Past Surgical History:  Procedure Laterality Date   LEG SURGERY  2005   MYRINGOTOMY WITH TUBE PLACEMENT Bilateral 01/10/2023   Procedure: MYRINGOTOMY WITH TUBE PLACEMENT;  Surgeon: Herminio Miu, MD;  Location: [facility];  Service: ENT;  Laterality: Bilateral;   OSTECTOMY  2002   spinal infusion  2010,2012    Medical History: Past Medical History:  Diagnosis Date   Achondroplasia    Anxiety    Arthritis    Depression    Polyarthralgia    PONV (postoperative nausea and vomiting)    PTSD (post-traumatic stress disorder)     Family History: Family History  Problem Relation Age  of Onset   Thyroid  cancer Mother 67   Breast cancer Maternal Grandmother        30/40s   Lung cancer Maternal Great-grandmother    COPD Maternal Great-grandmother     Social History   Socioeconomic History   Marital status: Single    Spouse name: Not on file   Number of children: Not on file   Years of education: Not on file   Highest education level: Not on file  Occupational History   Not on file  Tobacco Use   Smoking status: Never   Smokeless tobacco: Never  Vaping Use   Vaping status: Never Used  Substance and Sexual Activity   Alcohol use: Never   Drug use: Never   Sexual activity: Not Currently    Birth control/protection: None  Other Topics Concern   Not on file  Social History Narrative   Not on file   Social Drivers of Health   Financial Resource Strain: Medium Risk (10/06/2023)   Received from [facility]   Overall Financial Resource Strain (CARDIA)    Difficulty of Paying Living Expenses: Somewhat hard  Food Insecurity: Food Insecurity Present (10/06/2023)   Received from [facility]   Hunger Vital Sign    Within the past 12 months, you worried that your food would run out before you got the money to buy more.: Never true    Within the past 12 months, the food you bought  just didn't last and you didn't have money to get more.: Sometimes true  Transportation Needs: No Transportation Needs (10/06/2023)   Received from [facility] - Transportation    In the past 12 months, has lack of transportation kept you from medical appointments or from getting medications?: No    Lack of Transportation (Non-Medical): No  Physical Activity: Not on file  Stress: Not on file  Social Connections: Not on file  Intimate Partner Violence: Not on file      Review of Systems  Constitutional:  Positive for fatigue. Negative for activity change, appetite change, chills, fever and unexpected weight change.   HENT:  Positive for ear discharge and ear pain. Negative for congestion, postnasal drip, rhinorrhea, sneezing, sore throat and trouble swallowing.   Eyes: Negative.  Negative for redness.  Respiratory: Negative.  Negative for cough, chest tightness, shortness of breath and wheezing.   Cardiovascular: Negative.  Negative for chest pain and palpitations.  Gastrointestinal: Negative.  Negative for abdominal pain, blood in stool, constipation, diarrhea, nausea and vomiting.  Endocrine: Negative.   Genitourinary: Negative.  Negative for difficulty urinating, dysuria, frequency, hematuria and urgency.  Musculoskeletal:  Positive for arthralgias and back pain. Negative for joint swelling, myalgias and neck pain.  Skin: Negative.  Negative for rash and wound.  Allergic/Immunologic: Negative.  Negative for immunocompromised state.  Neurological: Negative.  Negative for dizziness, tremors, seizures, numbness and headaches.  Hematological: Negative.  Negative for adenopathy. Does not bruise/bleed easily.  Psychiatric/Behavioral:  Positive for behavioral problems (Depression) and sleep disturbance. Negative for self-injury and suicidal ideas. The patient is nervous/anxious.     Vital Signs: BP 120/83   Pulse 79   Temp 98.2 F (36.8 C)   Resp 16   Ht 4' 2.5 (1.283 m)   Wt 96 lb 6.4 oz (43.7 kg)   SpO2 99%   BMI 26.58 kg/m    Physical Exam Vitals reviewed.  Constitutional:      General: She is not in acute distress.    Appearance: She is well-developed. She is not diaphoretic.  HENT:     Head: Normocephalic and atraumatic.     Right Ear: External ear normal.     Left Ear: External ear normal.     Nose: Nose normal.     Mouth/Throat:     Pharynx: No oropharyngeal exudate.  Eyes:     General: No scleral icterus.       Right eye: No discharge.        Left eye: No discharge.     Conjunctiva/sclera: Conjunctivae normal.     Pupils: Pupils are equal, round, and reactive to light.  Neck:      Thyroid : No thyromegaly.     Vascular: No JVD.     Trachea: No tracheal deviation.  Cardiovascular:     Rate and Rhythm: Normal rate and regular rhythm.     Heart sounds: Normal heart sounds. No murmur heard.    No friction rub. No gallop.  Pulmonary:     Effort: Pulmonary effort is normal. No respiratory distress.     Breath sounds: Normal breath sounds. No stridor. No wheezing or rales.  Chest:     Chest wall: No tenderness.  Abdominal:     General: Bowel sounds are normal. There is no distension.     Palpations: Abdomen is soft. There is no mass.     Tenderness: There is no abdominal tenderness. There is no guarding or rebound.  Musculoskeletal:        General: No tenderness or deformity. Normal range of motion.     Cervical back: Normal range of motion and neck supple.  Lymphadenopathy:     Cervical: No cervical adenopathy.  Skin:    General: Skin is warm and dry.     Coloration: Skin is not pale.     Findings: No erythema or rash.  Neurological:     Mental Status: She is alert and oriented to person, place, and time.     Cranial Nerves: No cranial nerve deficit.     Motor: No abnormal muscle tone.     Coordination: Coordination normal.     Deep Tendon Reflexes: Reflexes are normal and symmetric.  Psychiatric:        Attention and Perception: She does not perceive auditory or visual hallucinations.        Mood and Affect: Mood is anxious and depressed. Affect is tearful.        Speech: Speech normal.        Behavior: Behavior normal. Behavior is cooperative.        Thought Content: Thought content normal. Thought content is not paranoid or delusional. Thought content does not include homicidal or suicidal ideation.        Judgment: Judgment is not impulsive.        Assessment/Plan: 1. Encounter for routine adult health examination with abnormal findings (Primary) Age-appropriate preventive screenings and vaccinations discussed, annual physical exam completed.  Routine labs for health maintenance results reviewed with patient. PHM updated.    2. 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 Acne vulgaris Continue tretinoin as prescribed  - tretinoin (RETIN-A) 0.025 % cream; Apply topically at bedtime. To face and wash off in the morning  Dispense: 45 g; Refill: 2  5. Severe anxiety with panic Continue prn alprazolam  as prescribed. Follow up in 3 months for additional refills  - ALPRAZolam  (XANAX ) 0.5 MG tablet; Take 1 tablet (0.5 mg total) by mouth 3 (three) times daily as needed for anxiety.  Dispense: 90 tablet; Refill: 2     General Counseling: Alexis Williams verbalizes understanding of the findings of todays visit and agrees with plan of treatment. I have discussed any further diagnostic evaluation that may be needed or ordered today. We also reviewed her medications today. she has been encouraged to call the office with any questions or concerns that should arise related to todays visit.    No orders of the defined types were placed in this encounter.   Meds ordered this encounter  Medications   ALPRAZolam  (XANAX ) 0.5 MG tablet    Sig: Take 1 tablet (0.5 mg total) by mouth 3 (three) times daily as needed for anxiety.    Dispense:  90 tablet    Refill:  2    For future refills   tretinoin (RETIN-A) 0.025 % cream    Sig: Apply topically at bedtime. To face and wash off in the morning    Dispense:  45 g    Refill:  2    Fill new script today.    Return in about 3 months (around 06/02/2024) for F/U, anxiety med refill, Irvin Bastin PCP.   Total time spent:30 Minutes Time spent includes review of chart, medications, test results, and follow up plan with the patient.   [place] Controlled Substance Database was reviewed by me.  This patient was seen by Mardy Maxin, FNP-C in  collaboration with Dr. Sigrid Bathe as a part of collaborative care agreement.  Shermika Balthaser R. Liana, MSN,  FNP-C Internal medicine

## 2024-03-09 ENCOUNTER — Telehealth: Payer: Self-pay | Admitting: Nurse Practitioner

## 2024-03-09 NOTE — Telephone Encounter (Signed)
 Awaiting 03/08/24 office notes for Urgent Otolaryngology referral-Alexis Williams

## 2024-03-18 ENCOUNTER — Telehealth: Payer: Self-pay | Admitting: Nurse Practitioner

## 2024-03-18 ENCOUNTER — Telehealth: Payer: Self-pay

## 2024-03-18 ENCOUNTER — Encounter: Payer: Self-pay | Admitting: Nurse Practitioner

## 2024-03-18 DIAGNOSIS — F410 Panic disorder [episodic paroxysmal anxiety] without agoraphobia: Secondary | ICD-10-CM

## 2024-03-18 NOTE — Telephone Encounter (Signed)
 Otolaryngology referral faxed to Dr. Kristi w/ [facility]; [phone]. Lvm notifying patient. Gave pt telephone [phone]

## 2024-03-19 MED ORDER — ALPRAZOLAM 0.5 MG PO TABS
0.2500 mg | ORAL_TABLET | Freq: Two times a day (BID) | ORAL | 0 refills | Status: AC | PRN
Start: 2024-03-22 — End: ?

## 2024-03-19 NOTE — Telephone Encounter (Signed)
 Patient's amount of alprazolam  that she takes has increased to 3 times daily as needed over the past few weeks. She is a consistent patient and takes her medication as prescribed.  This is the first incidence when she has accidentally destroyed any of her pills. 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 This is a single and first time accident per patient report.  She does need to see psychiatry. She was referred to a psychiatrist in early 2024 but did not follow through with seeing the psychiatrist because her psychiatric issues were under control with seeing the therapist alone. At this time, her anxiety is increased and she has had some increased stress and ended a serious relationship.  A psychiatry referral was offered but patient told the nurse over the phone that she has scheduled an appointment with a psychiatrist but it is 2 months from now.  The clonazepam prescription will be sent but it will be decreased to 1/2-1 tab twice daily as needed. This will need to be managed by her psychiatrist once she starts seeing them in 2 months.  She has tried some antidepressants including duloxetine , trazodone  and venlafaxine . She had some issues with these medications including adverse side effects.  Buspirone or hydroxyzine may be a good medication to try if she needs additional medication for anxiety.

## 2024-05-19 NOTE — Progress Notes (Signed)
 Attestation Statement:   I personally saw and evaluated the patient, and participated in the management and treatment plan as documented in the resident/fellow note.  GREGORY ANNETT SHAN, MD

## 2024-06-03 ENCOUNTER — Telehealth: Payer: Self-pay

## 2024-06-03 NOTE — Telephone Encounter (Signed)
 Completed P.A. for patient's Tretinoin  cream.

## 2024-06-04 ENCOUNTER — Ambulatory Visit: Admitting: Nurse Practitioner

## 2025-03-09 ENCOUNTER — Encounter: Admitting: Nurse Practitioner
